# Patient Record
Sex: Female | Born: 1975
Health system: Southern US, Community
[De-identification: ages and names within clinical notes are randomized; demographics above are authoritative.]

## PROBLEM LIST (undated history)

## (undated) ENCOUNTER — Ambulatory Visit: Admission: EM | Payer: 59 | Source: Home / Self Care

## (undated) DIAGNOSIS — N979 Female infertility, unspecified: Secondary | ICD-10-CM

## (undated) DIAGNOSIS — N939 Abnormal uterine and vaginal bleeding, unspecified: Secondary | ICD-10-CM

## (undated) DIAGNOSIS — I10 Essential (primary) hypertension: Secondary | ICD-10-CM

## (undated) DIAGNOSIS — N200 Calculus of kidney: Secondary | ICD-10-CM

## (undated) DIAGNOSIS — T7840XA Allergy, unspecified, initial encounter: Secondary | ICD-10-CM

## (undated) HISTORY — DX: Allergy, unspecified, initial encounter: T78.40XA

## (undated) HISTORY — DX: Essential (primary) hypertension: I10

## (undated) HISTORY — PX: OTHER SURGICAL HISTORY: SHX169

## (undated) HISTORY — DX: Calculus of kidney: N20.0

## (undated) HISTORY — DX: Female infertility, unspecified: N97.9

## (undated) HISTORY — DX: Abnormal uterine and vaginal bleeding, unspecified: N93.9

---

## 2020-01-23 ENCOUNTER — Encounter: Payer: Self-pay | Admitting: Physician Assistant

## 2020-01-23 ENCOUNTER — Ambulatory Visit (INDEPENDENT_AMBULATORY_CARE_PROVIDER_SITE_OTHER): Payer: No Typology Code available for payment source | Admitting: Physician Assistant

## 2020-01-23 ENCOUNTER — Other Ambulatory Visit: Payer: Self-pay

## 2020-01-23 VITALS — BP 118/76 | HR 88 | Temp 98.3°F | Ht 63.25 in | Wt 182.6 lb

## 2020-01-23 DIAGNOSIS — J3089 Other allergic rhinitis: Secondary | ICD-10-CM

## 2020-01-23 DIAGNOSIS — Z7689 Persons encountering health services in other specified circumstances: Secondary | ICD-10-CM | POA: Diagnosis not present

## 2020-01-23 DIAGNOSIS — Z01419 Encounter for gynecological examination (general) (routine) without abnormal findings: Secondary | ICD-10-CM | POA: Diagnosis not present

## 2020-01-23 DIAGNOSIS — Z0000 Encounter for general adult medical examination without abnormal findings: Secondary | ICD-10-CM | POA: Diagnosis not present

## 2020-01-23 MED ORDER — FLUTICASONE PROPIONATE 50 MCG/ACT NA SUSP: 2.0000 | Freq: Every day | NASAL | 2 refills | Status: AC | PRN

## 2020-01-23 NOTE — Progress Notes (Signed)
New Patient Office Visit  Subjective:  Patient ID: Claire King, female    DOB: August 13, 1975  Age: 44 y.o. MRN: [id number]  CC:  Chief Complaint  Patient presents with  . Establish Care    HPI Claire King presents to establish care and with concerns of elevated BP.  Patient reports she checks her BP at home few times a week and has noticed her readings consistently in the 130s-140/90s.  Denies headache, vision changes, chest pain, dizziness, or palpitations. Denies prior history of hypertension. 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 Reports poor hydration, usually when working isn't at good but tries to stay as hydrated as possible.  She moved from [place] about a year and a half ago, and has been having trouble with allergies. She does take Zyrtec when needed.  States her husband does report she sometimes snores, which she states could be related to her postnasal drainage and allergy symptoms. Pt denies history of hyperlipidemia, diabetes mellitus, or thyroid disease.  No past medical history on file. The histories are not reviewed yet. Please review them in the "History" navigator section and refresh this SmartLink.  Family History  Problem Relation Age of Onset  . Lung cancer Maternal Grandmother     Social History   Socioeconomic History  . Marital status: Married    Spouse name: Not on file  . Number of children: Not on file  . Years of education: Not on file  . Highest education level: Not on file  Occupational History  . Not on file  Tobacco Use  . Smoking status: Current Every Day Smoker    Packs/day: 0.25    Years: 15.00    Pack years: 3.75    Types: Cigarettes  . Smokeless tobacco: Never Used  Vaping Use  . Vaping Use: Never used  Substance and Sexual Activity  . Alcohol use: Never  . Drug use: Never  . Sexual activity: Yes    Birth control/protection: None    Comment: infertility  Other Topics Concern  . Not on file  Social History Narrative  . Not on  file   Social Determinants of Health   Financial Resource Strain:   . Difficulty of Paying Living Expenses:   Food Insecurity:   . Worried About Programme researcher, broadcasting/film/video in the Last Year:   . Barista in the Last Year:   Transportation Needs:   . Freight forwarder (Medical):   Marland Kitchen Lack of Transportation (Non-Medical):   Physical Activity:   . Days of Exercise per Week:   . Minutes of Exercise per Session:   Stress:   . Feeling of Stress :   Social Connections:   . Frequency of Communication with Friends and Family:   . Frequency of Social Gatherings with Friends and Family:   . Attends Religious Services:   . Active Member of Clubs or Organizations:   . Attends Banker Meetings:   Marland Kitchen Marital Status:   Intimate Partner Violence:   . Fear of Current or Ex-Partner:   . Emotionally Abused:   Marland Kitchen Physically Abused:   . Sexually Abused:     ROS Review of Systems  A fourteen system review of systems was performed and found to be positive as per HPI.  Objective:   Today's Vitals: BP 118/76   Pulse 88   Temp 98.3 F (36.8 C) (Oral)   Ht 5' 3.25" (1.607 m)   Wt 182 lb 9.6 oz (82.8  kg)   LMP 01/11/2020 (Exact Date)   SpO2 97% Comment: on RA  BMI 32.09 kg/m   Physical Exam General:  Well Developed, well nourished, appropriate for stated age.  Neuro:  Alert and oriented,  extra-ocular muscles intact  HEENT:  Normocephalic, atraumatic, conjunctiva clear, normal TM of both ears, neck supple Skin:  no gross rash, warm, pink. Cardiac:  RRR, S1 S2 Respiratory:  ECTA B/L and A/P, Not using accessory muscles, speaking in full sentences- unlabored. Vascular:  Ext warm, no cyanosis apprec.; cap RF less 2 sec. No gross esema Psych:  No HI/SI, judgement and insight good, Euthymic mood. Full Affect.   Assessment & Plan:   Problem List Items Addressed This Visit    None    Visit Diagnoses    Encounter to establish care    -  Primary   Well woman exam with  routine gynecological exam       Relevant Orders   Ambulatory referral to Obstetrics / Gynecology   Healthcare maintenance       Relevant Orders   CBC with Differential/Platelet   Comprehensive metabolic panel   Hemoglobin A1c   Lipid panel   TSH   Environmental and seasonal allergies       Relevant Medications   fluticasone (FLONASE) 50 MCG/ACT nasal spray     Healthcare maintenance: -Blood pressure today is WNL, so recommend to continue with ambulatory BP and pulse monitoring. -Discussed with patient secondary causes of hypertension including OSA.  Will continue to monitor and if more symptoms develop besides snoring will consider sleep study. -Continue good hydration. -Placed referral to OB/GYN for female examination with gynecologic exam. -Follow-up for CPE and FBW in 2 to 3 months.  Environmental and seasonal allergies: -Continue Zyrtec on a daily basis and start Flonase. -Recommend nasal rinses, especially before using nasal spray.  Outpatient Encounter Medications as of 01/23/2020  Medication Sig  . fluticasone (FLONASE) 50 MCG/ACT nasal spray Place 2 sprays into both nostrils daily as needed for allergies or rhinitis.   No facility-administered encounter medications on file as of 01/23/2020.    Follow-up: Return for CPE and FBW in 2-3 months.   Note:  This note was prepared with assistance of Dragon voice recognition software. Occasional wrong-word or sound-a-like substitutions may have occurred due to the inherent limitations of voice recognition software.  Mayer Masker, PA-C

## 2020-02-09 ENCOUNTER — Encounter: Payer: Self-pay | Admitting: Physician Assistant

## 2020-03-20 ENCOUNTER — Other Ambulatory Visit: Payer: Self-pay | Admitting: Physician Assistant

## 2020-03-20 DIAGNOSIS — Z0000 Encounter for general adult medical examination without abnormal findings: Secondary | ICD-10-CM

## 2020-03-22 ENCOUNTER — Other Ambulatory Visit: Payer: Self-pay

## 2020-03-22 ENCOUNTER — Other Ambulatory Visit: Payer: No Typology Code available for payment source

## 2020-03-22 DIAGNOSIS — Z0000 Encounter for general adult medical examination without abnormal findings: Secondary | ICD-10-CM

## 2020-03-23 LAB — CBC
Hematocrit: 45.3 % (ref 34.0–46.6)
Hemoglobin: 15.2 g/dL (ref 11.1–15.9)
MCH: 30.3 pg (ref 26.6–33.0)
MCHC: 33.6 g/dL (ref 31.5–35.7)
MCV: 90 fL (ref 79–97)
Platelets: 84 10*3/uL — CL (ref 150–450)
RBC: 5.01 x10E6/uL (ref 3.77–5.28)
RDW: 12.4 % (ref 11.7–15.4)
WBC: 3.6 10*3/uL (ref 3.4–10.8)

## 2020-03-23 LAB — COMPREHENSIVE METABOLIC PANEL
ALT: 16 IU/L (ref 0–32)
AST: 14 IU/L (ref 0–40)
Albumin/Globulin Ratio: 1.7 (ref 1.2–2.2)
Albumin: 4.2 g/dL (ref 3.8–4.8)
Alkaline Phosphatase: 100 IU/L (ref 48–121)
BUN/Creatinine Ratio: 14 (ref 9–23)
BUN: 12 mg/dL (ref 6–24)
Bilirubin Total: 0.3 mg/dL (ref 0.0–1.2)
CO2: 24 mmol/L (ref 20–29)
Calcium: 8.9 mg/dL (ref 8.7–10.2)
Chloride: 104 mmol/L (ref 96–106)
Creatinine, Ser: 0.83 mg/dL (ref 0.57–1.00)
GFR calc Af Amer: 99 mL/min/{1.73_m2} (ref >59)
GFR calc non Af Amer: 86 mL/min/{1.73_m2} (ref >59)
Globulin, Total: 2.5 g/dL (ref 1.5–4.5)
Glucose: 82 mg/dL (ref 65–99)
Potassium: 4.4 mmol/L (ref 3.5–5.2)
Sodium: 141 mmol/L (ref 134–144)
Total Protein: 6.7 g/dL (ref 6.0–8.5)

## 2020-03-23 LAB — HEMOGLOBIN A1C
Est. average glucose Bld gHb Est-mCnc: 103 mg/dL
Hgb A1c MFr Bld: 5.2 % (ref 4.8–5.6)

## 2020-03-23 LAB — LIPID PANEL
Chol/HDL Ratio: 3 ratio (ref 0.0–4.4)
Cholesterol, Total: 133 mg/dL (ref 100–199)
HDL: 44 mg/dL (ref >39)
LDL Chol Calc (NIH): 74 mg/dL (ref 0–99)
Triglycerides: 72 mg/dL (ref 0–149)
VLDL Cholesterol Cal: 15 mg/dL (ref 5–40)

## 2020-03-23 LAB — TSH: TSH: 2.43 u[IU]/mL (ref 0.450–4.500)

## 2020-03-25 ENCOUNTER — Encounter: Payer: No Typology Code available for payment source | Admitting: Physician Assistant

## 2020-03-28 ENCOUNTER — Ambulatory Visit (INDEPENDENT_AMBULATORY_CARE_PROVIDER_SITE_OTHER): Payer: No Typology Code available for payment source | Admitting: Obstetrics and Gynecology

## 2020-03-28 ENCOUNTER — Other Ambulatory Visit: Payer: Self-pay

## 2020-03-28 ENCOUNTER — Other Ambulatory Visit (HOSPITAL_COMMUNITY)
Admission: RE | Admit: 2020-03-28 | Discharge: 2020-03-28 | Disposition: A | Discharge status: Home / Self Care | Payer: No Typology Code available for payment source | Source: Ambulatory Visit | Attending: Obstetrics and Gynecology | Admitting: Obstetrics and Gynecology

## 2020-03-28 ENCOUNTER — Encounter: Payer: Self-pay | Admitting: Obstetrics and Gynecology

## 2020-03-28 VITALS — BP 132/66 | HR 99 | Ht 63.0 in | Wt 185.2 lb

## 2020-03-28 DIAGNOSIS — N921 Excessive and frequent menstruation with irregular cycle: Secondary | ICD-10-CM

## 2020-03-28 DIAGNOSIS — Z01419 Encounter for gynecological examination (general) (routine) without abnormal findings: Secondary | ICD-10-CM | POA: Diagnosis not present

## 2020-03-28 DIAGNOSIS — R6882 Decreased libido: Secondary | ICD-10-CM | POA: Diagnosis not present

## 2020-03-28 DIAGNOSIS — Z124 Encounter for screening for malignant neoplasm of cervix: Secondary | ICD-10-CM

## 2020-03-28 DIAGNOSIS — F17200 Nicotine dependence, unspecified, uncomplicated: Secondary | ICD-10-CM

## 2020-03-28 DIAGNOSIS — Z8742 Personal history of other diseases of the female genital tract: Secondary | ICD-10-CM | POA: Diagnosis not present

## 2020-03-28 DIAGNOSIS — N6452 Nipple discharge: Secondary | ICD-10-CM | POA: Diagnosis not present

## 2020-03-28 MED ORDER — MEDROXYPROGESTERONE ACETATE 10 MG PO TABS: ORAL_TABLET | ORAL | 0 refills | Status: DC

## 2020-03-28 NOTE — Patient Instructions (Signed)
EXERCISE AND DIET:  We recommended that you start or continue a regular exercise program for good health. Regular exercise means any activity that makes your heart beat faster and makes you sweat.  We recommend exercising at least 30 minutes per day at least 3 days a week, preferably 4 or 5.  We also recommend a diet low in fat and sugar.  Inactivity, poor dietary choices and obesity can cause diabetes, heart attack, stroke, and kidney damage, among others.    ALCOHOL AND SMOKING:  Women should limit their alcohol intake to no more than 7 drinks/beers/glasses of wine (combined, not each!) per week. Moderation of alcohol intake to this level decreases your risk of breast cancer and liver damage. And of course, no recreational drugs are part of a healthy lifestyle.  And absolutely no smoking or even second hand smoke. Most people know smoking can cause heart and lung diseases, but did you know it also contributes to weakening of your bones? Aging of your skin?  Yellowing of your teeth and nails?  CALCIUM AND VITAMIN D:  Adequate intake of calcium and Vitamin D are recommended.  The recommendations for exact amounts of these supplements seem to change often, but generally speaking 1,000 mg of calcium (between diet and supplement) and 800 units of Vitamin D per day seems prudent. Certain women may benefit from higher intake of Vitamin D.  If you are among these women, your doctor will have told you during your visit.    PAP SMEARS:  Pap smears, to check for cervical cancer or precancers,  have traditionally been done yearly, although recent scientific advances have shown that most women can have pap smears less often.  However, every woman still should have a physical exam from her gynecologist every year. It will include a breast check, inspection of the vulva and vagina to check for abnormal growths or skin changes, a visual exam of the cervix, and then an exam to evaluate the size and shape of the uterus and  ovaries.  And after 44 years of age, a rectal exam is indicated to check for rectal cancers. We will also provide age appropriate advice regarding health maintenance, like when you should have certain vaccines, screening for sexually transmitted diseases, bone density testing, colonoscopy, mammograms, etc.   MAMMOGRAMS:  All women over 40 years old should have a yearly mammogram. Many facilities now offer a "3D" mammogram, which may cost around $50 extra out of pocket. If possible,  we recommend you accept the option to have the 3D mammogram performed.  It both reduces the number of women who will be called back for extra views which then turn out to be normal, and it is better than the routine mammogram at detecting truly abnormal areas.    COLON CANCER SCREENING: Now recommend starting at age 45. At this time colonoscopy is not covered for routine screening until 50. There are take home tests that can be done between 45-49.   COLONOSCOPY:  Colonoscopy to screen for colon cancer is recommended for all women at age 50.  We know, you hate the idea of the prep.  We agree, BUT, having colon cancer and not knowing it is worse!!  Colon cancer so often starts as a polyp that can be seen and removed at colonscopy, which can quite literally save your life!  And if your first colonoscopy is normal and you have no family history of colon cancer, most women don't have to have it again for   10 years.  Once every ten years, you can do something that may end up saving your life, right?  We will be happy to help you get it scheduled when you are ready.  Be sure to check your insurance coverage so you understand how much it will cost.  It may be covered as a preventative service at no cost, but you should check your particular policy.      Breast Self-Awareness Breast self-awareness means being familiar with how your breasts look and feel. It involves checking your breasts regularly and reporting any changes to your  health care provider. Practicing breast self-awareness is important. A change in your breasts can be a sign of a serious medical problem. Being familiar with how your breasts look and feel allows you to find any problems early, when treatment is more likely to be successful. All women should practice breast self-awareness, including women who have had breast implants. How to do a breast self-exam One way to learn what is normal for your breasts and whether your breasts are changing is to do a breast self-exam. To do a breast self-exam: Look for Changes  1. Remove all the clothing above your waist. 2. Stand in front of a mirror in a room with good lighting. 3. Put your hands on your hips. 4. Push your hands firmly downward. 5. Compare your breasts in the mirror. Look for differences between them (asymmetry), such as: ? Differences in shape. ? Differences in size. ? Puckers, dips, and bumps in one breast and not the other. 6. Look at each breast for changes in your skin, such as: ? Redness. ? Scaly areas. 7. Look for changes in your nipples, such as: ? Discharge. ? Bleeding. ? Dimpling. ? Redness. ? A change in position. Feel for Changes Carefully feel your breasts for lumps and changes. It is best to do this while lying on your back on the floor and again while sitting or standing in the shower or tub with soapy water on your skin. Feel each breast in the following way:  Place the arm on the side of the breast you are examining above your head.  Feel your breast with the other hand.  Start in the nipple area and make  inch (2 cm) overlapping circles to feel your breast. Use the pads of your three middle fingers to do this. Apply light pressure, then medium pressure, then firm pressure. The light pressure will allow you to feel the tissue closest to the skin. The medium pressure will allow you to feel the tissue that is a little deeper. The firm pressure will allow you to feel the tissue  close to the ribs.  Continue the overlapping circles, moving downward over the breast until you feel your ribs below your breast.  Move one finger-width toward the center of the body. Continue to use the  inch (2 cm) overlapping circles to feel your breast as you move slowly up toward your collarbone.  Continue the up and down exam using all three pressures until you reach your armpit.  Write Down What You Find  Write down what is normal for each breast and any changes that you find. Keep a written record with breast changes or normal findings for each breast. By writing this information down, you do not need to depend only on memory for size, tenderness, or location. Write down where you are in your menstrual cycle, if you are still menstruating. If you are having trouble noticing differences   in your breasts, do not get discouraged. With time you will become more familiar with the variations in your breasts and more comfortable with the exam. How often should I examine my breasts? Examine your breasts every month. If you are breastfeeding, the best time to examine your breasts is after a feeding or after using a breast pump. If you menstruate, the best time to examine your breasts is 5-7 days after your period is over. During your period, your breasts are lumpier, and it may be more difficult to notice changes. When should I see my health care provider? See your health care provider if you notice:  A change in shape or size of your breasts or nipples.  A change in the skin of your breast or nipples, such as a reddened or scaly area.  Unusual discharge from your nipples.  A lump or thick area that was not there before.  Pain in your breasts.  Anything that concerns you.  

## 2020-03-28 NOTE — Progress Notes (Signed)
44 y.o. [id number] Married White or Caucasian Not Hispanic or Latino female here for annual exam.  She has been spotting since Aug 12.  Period Pattern: Regular Menstrual Flow: Heavy, Light Menstrual Control: Panty liner, Thin pad Menstrual Control Change Freq (Hours): 8 Dysmenorrhea: None   Prior to August 12 cycles were monthly, they started getting heavy in May. Cycles last x 4 days. Prior to May she was saturating a tampon in up to 6-7 hours, since May she was saturating a super tampon in up to 2 hours. She started having mid cycle spotting in May. She started spotting on August 12 and has continued spotting.  Not anemic, TSH normal last week.  No vasomotor symptoms.   She has a 15 year h/o occasional leakage of yellow green d/c from her left breast.   Not sexually active since January, no libido. Marriage is great. Under stress.   No LMP recorded.          Sexually active: Yes.    The current method of family planning is  infertility .    Exercising: No.  The patient does not participate in regular exercise at present. Smoker:  Yes, 1/2 PPD, no plans to quit.   Health Maintenance: Pap:  10 years ago  History of abnormal Pap:  no MMG:  none BMD:   none Colonoscopy: none  TDaP:  Up to date  Gardasil: none    reports that she has been smoking cigarettes. She has a 7.50 pack-year smoking history. She has never used smokeless tobacco. She reports that she does not drink alcohol and does not use drugs. She is a Charity fundraiser on a covid floor. She moved here from [place] 2 years ago. Son is 20, living with them, working, considering school.  Past Medical History:  Diagnosis Date  . Abnormal uterine bleeding   . Infertility, female     Past Surgical History:  Procedure Laterality Date  . kidney stone with stent      Current Outpatient Medications  Medication Sig Dispense Refill  . fluticasone (FLONASE) 50 MCG/ACT nasal spray Place 2 sprays into both nostrils daily as needed for  allergies or rhinitis. 15.8 mL 2   No current facility-administered medications for this visit.    Family History  Problem Relation Age of Onset  . Lung cancer Maternal Grandmother     Review of Systems  Genitourinary: Positive for menstrual problem and vaginal bleeding.  All other systems reviewed and are negative.   Exam:   BP 132/66   Pulse 99   Ht 5\' 3"  (1.6 m)   Wt 185 lb 3.2 oz (84 kg)   SpO2 100%   BMI 32.81 kg/m   Weight change: @WEIGHTCHANGE @ Height:   Height: 5\' 3"  (160 cm)  Ht Readings from Last 3 Encounters:  03/28/20 5\' 3"  (1.6 m)  01/23/20 5' 3.25" (1.607 m)    General appearance: alert, cooperative and appears stated age Head: Normocephalic, without obvious abnormality, atraumatic Neck: no adenopathy, supple, symmetrical, trachea midline and thyroid normal to inspection and palpation Lungs: clear to auscultation bilaterally Cardiovascular: regular rate and rhythm Breasts: normal appearance, no masses or tenderness Abdomen: soft, non-tender; non distended,  no masses,  no organomegaly Extremities: extremities normal, atraumatic, no cyanosis or edema Skin: Skin color, texture, turgor normal. No rashes or lesions Lymph nodes: Cervical, supraclavicular, and axillary nodes normal. No abnormal inguinal nodes palpated Neurologic: Grossly normal   Pelvic: External genitalia:  no lesions  Urethra:  normal appearing urethra with no masses, tenderness or lesions              Bartholins and Skenes: normal                 Vagina: normal appearing vagina with normal color and discharge, no lesions. Small amount of blood vagina.               Cervix: no lesions               Bimanual Exam:  Uterus:  normal size, contour, position, consistency, mobility, non-tender and retroverted              Adnexa: no mass, fullness, tenderness               Rectovaginal: Confirms               Anus:  normal sphincter tone, no lesions  The risks of endometrial biopsy  were reviewed and a consent was obtained.  A speculum was placed in the vagina and the cervix was cleansed with betadine. A tenaculum was placed on the cervix and the pipelle was placed into the endometrial cavity. The uterus sounded to 8 cm. The endometrial biopsy was performed, taking care to get a representative sample, sampling 360 degrees of the uterine cavity. moderate tissue was obtained. The tenaculum and speculum were removed. There were no complications.    Carolynn Serve chaperoned for the exam.  A:  Well Woman with normal exam  AUB, menometrorrhagia suspect anovulatory bleed currently.  Not sexually in 9 months.  No libido  Left nipple discharge   P:   Pap with hpv  Endometrial biopsy done  Return for ultrasound  Discussed breast self exam  Discussed calcium and vit D intake  Provera x 10 days  Prolactin level  Diagnostic breast imaging, attention left breast  Screening labs UTD

## 2020-03-29 ENCOUNTER — Telehealth: Payer: Self-pay | Admitting: *Deleted

## 2020-03-29 ENCOUNTER — Other Ambulatory Visit: Payer: Self-pay | Admitting: *Deleted

## 2020-03-29 DIAGNOSIS — N6452 Nipple discharge: Secondary | ICD-10-CM

## 2020-03-29 LAB — PROLACTIN: Prolactin: 36.8 ng/mL — ABNORMAL HIGH (ref 4.8–23.3)

## 2020-03-29 NOTE — Telephone Encounter (Signed)
-----   Message from Romualdo Bolk, MD sent at 03/29/2020 11:15 AM EDT ----- Please inform the patient that the prolactin level is elevated and have her return for a fasting level.  Her pap and endometrial biopsy

## 2020-03-29 NOTE — Telephone Encounter (Signed)
Spoke with patient. Confirmed no previous imaging. Patient request to schedule at [facility].  Orders placed for bilateral Dx MMG and left breast US, if needed. Patient states her schedule varies, she would prefer to call directly to schedule. Patient is aware to contact the office if any additional questions or assistance with scheduling.   Patient is in MMG hold.   Encounter closed.

## 2020-03-29 NOTE — Telephone Encounter (Signed)
Patient returned call. Results reviewed with patient as seen below and she verbalized understanding. Patient states she is having blood work done on 04-02-20 at PCP office, asking if she can have level drawn with that blood work.   RN attempted to reach Primary Care at [facility], but office closed at 12pm today. RN advised patient that order for lab work had been placed, but would need to confirm with PCP on Monday that they would be able to draw lab there. Advised patient would return call on Monday after speaking with PCFO. Patient verbalized understanding and agreeable.

## 2020-03-29 NOTE — Telephone Encounter (Signed)
Patient returned a call to Jill.   

## 2020-03-29 NOTE — Telephone Encounter (Signed)
Left message to call Noreene Larsson, RN at [facility] [phone].   No previous MMG hx on file.  Need to confirm where patient wants to go for breast  imaging.   Placed in MMG hold.

## 2020-03-29 NOTE — Telephone Encounter (Signed)
Message left to return call to Triage Nurse at [phone].    

## 2020-03-29 NOTE — Telephone Encounter (Signed)
-----   Message from Romualdo Bolk, MD sent at 03/28/2020  6:33 PM EDT ----- Please set her up for diagnostic breast imaging. H/o intermittent yellow/green d/c from left nipple for 15 years.  Thanks, Noreene Larsson

## 2020-04-01 ENCOUNTER — Other Ambulatory Visit: Payer: Self-pay | Admitting: Physician Assistant

## 2020-04-01 ENCOUNTER — Telehealth: Payer: Self-pay | Admitting: *Deleted

## 2020-04-01 DIAGNOSIS — Z0000 Encounter for general adult medical examination without abnormal findings: Secondary | ICD-10-CM

## 2020-04-01 DIAGNOSIS — N939 Abnormal uterine and vaginal bleeding, unspecified: Secondary | ICD-10-CM

## 2020-04-01 LAB — CYTOLOGY - PAP
Comment: NEGATIVE
Diagnosis: NEGATIVE
High risk HPV: NEGATIVE

## 2020-04-01 LAB — SURGICAL PATHOLOGY

## 2020-04-01 NOTE — Telephone Encounter (Signed)
Call to [facility], spoke with Joselyn Glassman, who states okay for patient to have labs drawn tomorrow. States their office uses [facility] services and phlebotomist will release order. Future order present for prolactin order.

## 2020-04-01 NOTE — Telephone Encounter (Signed)
Leda Min, RN  04/01/2020 3:30 PM EDT Back to Top    Left message to call Noreene Larsson, RN at [facility] [phone].

## 2020-04-01 NOTE — Telephone Encounter (Signed)
-----   Message from Romualdo Bolk, MD sent at 04/01/2020  1:08 PM EDT ----- Please let the patient know that her endometrial biopsy is benign and set her up for a pelvic ultrasound to further evaluate her AUB

## 2020-04-01 NOTE — Telephone Encounter (Signed)
Call to patient. Patient advised could have prolactin level drawn tomorrow at [facility]. Patient advised would need to fast prior to appointment. Patient verbalized understanding and appreciative.   Routing to provider and will close encounter.

## 2020-04-02 ENCOUNTER — Other Ambulatory Visit: Payer: Self-pay

## 2020-04-02 ENCOUNTER — Other Ambulatory Visit: Payer: No Typology Code available for payment source

## 2020-04-02 DIAGNOSIS — N6452 Nipple discharge: Secondary | ICD-10-CM

## 2020-04-02 DIAGNOSIS — Z0000 Encounter for general adult medical examination without abnormal findings: Secondary | ICD-10-CM

## 2020-04-03 ENCOUNTER — Telehealth: Payer: Self-pay | Admitting: Physician Assistant

## 2020-04-03 DIAGNOSIS — D696 Thrombocytopenia, unspecified: Secondary | ICD-10-CM

## 2020-04-03 LAB — CBC
Hematocrit: 44.4 % (ref 34.0–46.6)
Hemoglobin: 14.4 g/dL (ref 11.1–15.9)
MCH: 29 pg (ref 26.6–33.0)
MCHC: 32.4 g/dL (ref 31.5–35.7)
MCV: 90 fL (ref 79–97)
Platelets: 78 10*3/uL — CL (ref 150–450)
RBC: 4.96 x10E6/uL (ref 3.77–5.28)
RDW: 12.7 % (ref 11.7–15.4)
WBC: 4 10*3/uL (ref 3.4–10.8)

## 2020-04-03 LAB — PROLACTIN: Prolactin: 27.1 ng/mL — ABNORMAL HIGH (ref 4.8–23.3)

## 2020-04-03 NOTE — Telephone Encounter (Signed)
Spoke with patient, advised of results and recommendations as seen below per Dr. Oscar La.  Patient agreeable to proceed with PUS. PUS scheduled for 04/16/20 at 2:30pm, consult to follow with Dr. Oscar La. Patient declines earlier appt. Order placed for precert.   Patient reports she started Provera 10 mg bid on 9/16, bleeding stopped on 9/19. She switched to 10 mg daily on 9/20. Patient reports spotting starting this morning, asking of she should continue daily?   Advised to continue Provera 10 mg daily to complete 10 days as prescribed, contact office if bleeding increases or any new symptoms develop. Advised I will update Dr. Oscar La and return call if any additional recommendations. Patient will need new Provera Rx if she needs to increase dosage.  Dr. Oscar La -please review.    Cc: Hayley Carder

## 2020-04-03 NOTE — Telephone Encounter (Signed)
-----   Message from Mayer Masker, [place] sent at 04/03/2020  8:17 AM EDT ----- Please call Ms. Landress and notify CBC shows platelets have deceased from prior and recommend referral to hematology for further evaluation/work up.   Thank you, Kandis Cocking

## 2020-04-03 NOTE — Telephone Encounter (Signed)
Patient is aware of lab results and is agreeable to referral to Hematology.   Referral has been placed as urgent per [facility]. AS, CMA

## 2020-04-04 ENCOUNTER — Other Ambulatory Visit: Payer: Self-pay | Admitting: *Deleted

## 2020-04-04 DIAGNOSIS — N6452 Nipple discharge: Secondary | ICD-10-CM

## 2020-04-04 DIAGNOSIS — R7989 Other specified abnormal findings of blood chemistry: Secondary | ICD-10-CM

## 2020-04-04 NOTE — Telephone Encounter (Signed)
Reviewed with Dr. Jertson, no additional recommendations.   Encounter closed.  

## 2020-04-05 ENCOUNTER — Telehealth: Payer: Self-pay

## 2020-04-05 ENCOUNTER — Telehealth: Payer: Self-pay | Admitting: Hematology and Oncology

## 2020-04-05 NOTE — Telephone Encounter (Signed)
Received a new hem referral from Jordan Hawks, PA for the thrombocytopenia. Claire King has been cld and scheduled to see Dr. Pamelia Hoit on 10/5 at 830am. Pt aware to arrive 30 minutes early.

## 2020-04-05 NOTE — Telephone Encounter (Signed)
Spoke with patient regarding benefits for scheduled Pelvic ultrasound. Patient acknowledges understanding of information presented. Encounter closed. 

## 2020-04-11 ENCOUNTER — Other Ambulatory Visit: Payer: Self-pay

## 2020-04-11 ENCOUNTER — Ambulatory Visit
Admission: RE | Admit: 2020-04-11 | Discharge: 2020-04-11 | Disposition: A | Discharge status: Home / Self Care | Payer: No Typology Code available for payment source | Source: Ambulatory Visit | Attending: Obstetrics and Gynecology | Admitting: Obstetrics and Gynecology

## 2020-04-11 DIAGNOSIS — N6452 Nipple discharge: Secondary | ICD-10-CM

## 2020-04-11 DIAGNOSIS — R7989 Other specified abnormal findings of blood chemistry: Secondary | ICD-10-CM

## 2020-04-11 MED ORDER — GADOBENATE DIMEGLUMINE 529 MG/ML IV SOLN
10.0000 mL | Freq: Once | INTRAVENOUS | Status: AC | PRN
  Administered 2020-04-11: 10 mL via INTRAVENOUS

## 2020-04-12 ENCOUNTER — Telehealth: Payer: Self-pay

## 2020-04-12 ENCOUNTER — Encounter: Payer: Self-pay | Admitting: Obstetrics and Gynecology

## 2020-04-12 NOTE — Telephone Encounter (Signed)
Pt sent following mychart message:   Kenlyn, Lose, MD 3 hours ago (11:45 AM)  SS I completed the provera yesterday and have started bleeding again heavier than spotting.  It is a little early for when my normal period should start but I wanted to make you aware.  Noted small clots too.

## 2020-04-12 NOTE — Telephone Encounter (Signed)
Spoke with pt. Pt sent mychart message to give update to Dr Oscar La.  Pt states started having vaginal bleeding on 9/30 PM after finishing Provera Rx the same day. Pt states changing a tampon/pad once a day for now, but seemed "heavier" to her than spotting with tiny clots.  Pt denies heavy, soaking pad/tampon, large clots, feeling lightheaded or dizzy at this time.  Pt advised to continue to monitor and calendar bleeding. Pt agreeable.  ER precautions given over weekend.  Pt has OV planned on 04/16/20. Pt advised to keep appt with Dr Oscar La to discuss further. Pt agreeable and verbalized understanding to date and time of appt.   Routing [facility] Encounter closed.

## 2020-04-15 NOTE — Progress Notes (Signed)
[facility] CONSULT NOTE  Patient Care Team: Mayer Masker, PA-C as PCP - General (Physician Assistant)  CHIEF COMPLAINTS/PURPOSE OF CONSULTATION:  Newly diagnosed thrombocytopenia   HISTORY OF PRESENTING ILLNESS:  Claire King 44 y.o. female is here because of recent diagnosis of thrombocytopenia. She is referred by Jordan Hawks, PA. Labs on 04/02/20 showed platelets 78, and on 03/22/20 platelets 84. She presents to the clinic today for initial evaluation.  She has not had any problems with bruising or bleeding.  There has not been any new medications have been started.  She has not been ill or sick.  Her only complaint is intermittent headaches in the mornings.  She is a cardiac nurse.  She has been experiencing in irregular cycles and is seeing a gynecologist and she is going to be on Depo-Provera.  I reviewed her records extensively and collaborated the history with the patient.  MEDICAL HISTORY:  Past Medical History:  Diagnosis Date  . Abnormal uterine bleeding   . Infertility, female     SURGICAL HISTORY: Past Surgical History:  Procedure Laterality Date  . kidney stone with stent      SOCIAL HISTORY: Social History   Socioeconomic History  . Marital status: Married    Spouse name: Not on file  . Number of children: Not on file  . Years of education: Not on file  . Highest education level: Not on file  Occupational History  . Not on file  Tobacco Use  . Smoking status: Current Every Day Smoker    Packs/day: 0.50    Years: 15.00    Pack years: 7.50    Types: Cigarettes  . Smokeless tobacco: Never Used  Vaping Use  . Vaping Use: Never used  Substance and Sexual Activity  . Alcohol use: Never  . Drug use: Never  . Sexual activity: Yes    Birth control/protection: None    Comment: infertility  Other Topics Concern  . Not on file  Social History Narrative  . Not on file   Social Determinants of Health   Financial Resource Strain:     . Difficulty of Paying Living Expenses: Not on file  Food Insecurity:   . Worried About Programme researcher, broadcasting/film/video in the Last Year: Not on file  . Ran Out of Food in the Last Year: Not on file  Transportation Needs:   . Lack of Transportation (Medical): Not on file  . Lack of Transportation (Non-Medical): Not on file  Physical Activity:   . Days of Exercise per Week: Not on file  . Minutes of Exercise per Session: Not on file  Stress:   . Feeling of Stress : Not on file  Social Connections:   . Frequency of Communication with Friends and Family: Not on file  . Frequency of Social Gatherings with Friends and Family: Not on file  . Attends Religious Services: Not on file  . Active Member of Clubs or Organizations: Not on file  . Attends Banker Meetings: Not on file  . Marital Status: Not on file  Intimate Partner Violence:   . Fear of Current or Ex-Partner: Not on file  . Emotionally Abused: Not on file  . Physically Abused: Not on file  . Sexually Abused: Not on file    FAMILY HISTORY: Family History  Problem Relation Age of Onset  . Lung cancer Maternal Grandmother     ALLERGIES:  has No Known Allergies.  MEDICATIONS:  Current Outpatient Medications  Medication  Sig Dispense Refill  . fluticasone (FLONASE) 50 MCG/ACT nasal spray Place 2 sprays into both nostrils daily as needed for allergies or rhinitis. 15.8 mL 2  . medroxyPROGESTERone (PROVERA) 10 MG tablet 1 tablet po BID until bleeding stops, then take 1 tablet a day for 10 days. 20 tablet 0   No current facility-administered medications for this visit.    REVIEW OF SYSTEMS:     All other systems were reviewed with the patient and are negative.  PHYSICAL EXAMINATION: ECOG PERFORMANCE STATUS: 1 - Symptomatic but completely ambulatory  Vitals:   04/16/20 0826  BP: (!) 140/98  Pulse: 97  Resp: 18  Temp: 98 F (36.7 C)   Filed Weights   04/16/20 0826  Weight: 182 lb 3.2 oz (82.6 kg)        LABORATORY DATA:  I have reviewed the data as listed Lab Results  Component Value Date   WBC 3.6 (L) 04/16/2020   HGB 13.9 04/16/2020   HCT 41.5 04/16/2020   MCV 89.1 04/16/2020   PLT 247 04/16/2020   Lab Results  Component Value Date   NA 141 03/22/2020   K 4.4 03/22/2020   CL 104 03/22/2020   CO2 24 03/22/2020    RADIOGRAPHIC STUDIES: I have personally reviewed the radiological reports and agreed with the findings in the report.  ASSESSMENT AND PLAN:  Thrombocytopenia (HCC) Lab review: 03/22/2020: Platelets 84 04/03/2020: Platelets 78, rest of the CBC normal Additional comments were made that there may be platelet aggregation of the sample.  Differential diagnosis: 1. Low-grade ITP 2. medication induced: On further review patient is not taking any medications that are associated with thrombocytopenia 3. Bone marrow factors 4. Hepatitis B/C 5. Splenomegaly: No enlarged spleen was palpable to physical exam. 6.  Spurious thrombocytopenia due to platelet clumping  We performed the blood work in citrate tube and found that her platelet count is normal at 247. Therefore no additional work-up is necessary.  Patient is very relieved to see this result. I would recommend her future CBCs to be drawn in a citrate tube (blue top tube) to avoid platelet clumping.  Please do not hesitate to call us if there are any further questions or concerns.   All questions were answered. The patient knows to call the clinic with any problems, questions or concerns.   Sabas Sous, MD, MPH 04/16/2020    I, Molly Dorshimer, am acting as scribe for Serena Croissant, MD.  I have reviewed the above documentation for accuracy and completeness, and I agree with the above.

## 2020-04-16 ENCOUNTER — Encounter: Payer: Self-pay | Admitting: Obstetrics and Gynecology

## 2020-04-16 ENCOUNTER — Ambulatory Visit: Payer: No Typology Code available for payment source | Admitting: Obstetrics and Gynecology

## 2020-04-16 ENCOUNTER — Inpatient Hospital Stay (HOSPITAL_BASED_OUTPATIENT_CLINIC_OR_DEPARTMENT_OTHER): Payer: No Typology Code available for payment source | Admitting: Hematology and Oncology

## 2020-04-16 ENCOUNTER — Ambulatory Visit (INDEPENDENT_AMBULATORY_CARE_PROVIDER_SITE_OTHER): Payer: No Typology Code available for payment source

## 2020-04-16 ENCOUNTER — Inpatient Hospital Stay: Payer: No Typology Code available for payment source | Attending: Hematology and Oncology

## 2020-04-16 ENCOUNTER — Other Ambulatory Visit: Payer: Self-pay

## 2020-04-16 VITALS — BP 122/64 | HR 87 | Ht 63.0 in | Wt 182.0 lb

## 2020-04-16 DIAGNOSIS — F17210 Nicotine dependence, cigarettes, uncomplicated: Secondary | ICD-10-CM | POA: Diagnosis not present

## 2020-04-16 DIAGNOSIS — N939 Abnormal uterine and vaginal bleeding, unspecified: Secondary | ICD-10-CM

## 2020-04-16 DIAGNOSIS — D696 Thrombocytopenia, unspecified: Secondary | ICD-10-CM | POA: Diagnosis present

## 2020-04-16 DIAGNOSIS — Z801 Family history of malignant neoplasm of trachea, bronchus and lung: Secondary | ICD-10-CM | POA: Diagnosis not present

## 2020-04-16 LAB — CBC WITH DIFFERENTIAL (CANCER CENTER ONLY)
Abs Immature Granulocytes: 0 10*3/uL (ref 0.00–0.07)
Basophils Absolute: 0 10*3/uL (ref 0.0–0.1)
Basophils Relative: 1 %
Eosinophils Absolute: 0.2 10*3/uL (ref 0.0–0.5)
Eosinophils Relative: 7 %
HCT: 41.5 % (ref 36.0–46.0)
Hemoglobin: 13.9 g/dL (ref 12.0–15.0)
Immature Granulocytes: 0 %
Lymphocytes Relative: 30 %
Lymphs Abs: 1.1 10*3/uL (ref 0.7–4.0)
MCH: 29.8 pg (ref 26.0–34.0)
MCHC: 33.5 g/dL (ref 30.0–36.0)
MCV: 89.1 fL (ref 80.0–100.0)
Monocytes Absolute: 0.4 10*3/uL (ref 0.1–1.0)
Monocytes Relative: 12 %
Neutro Abs: 1.8 10*3/uL (ref 1.7–7.7)
Neutrophils Relative %: 50 %
Platelet Count: 247 10*3/uL (ref 150–400)
RBC: 4.66 MIL/uL (ref 3.87–5.11)
RDW: 13.5 % (ref 11.5–15.5)
WBC Count: 3.6 10*3/uL — ABNORMAL LOW (ref 4.0–10.5)
nRBC: 0 % (ref 0.0–0.2)

## 2020-04-16 LAB — PLATELET BY CITRATE

## 2020-04-16 NOTE — Progress Notes (Signed)
GYNECOLOGY  VISIT   HPI: 44 y.o.   Married White or Caucasian Not Hispanic or Latino  female   [phone] with No LMP recorded.   here for evaluation of AUB. The patient was seen last month c/o a marked increase in monthly menstrual flow starting in 5/21 and then a likely anovulatory cycle. Endometrial biopsy returned with inactive endometrium with focal breakdown.   Not anemic, normal TSH, normal pap. Prolactin was elevated at 36.8, repeat prolactin still elevated at 27.1. Brain MRI without pituitary tumor, there were non specific changes (will f/u with primary). She has daily headaches, but denies migraines.   At the time of her visit last month the patient also reported left nipple discharge. She is scheduled for breast imaging in 2 days.   GYNECOLOGIC HISTORY: No LMP recorded. Contraception: abstaining, infertility Menopausal hormone therapy: none        OB History    Gravida  4   Para  2   Term  1   Preterm  1   AB  2   Living  1     SAB  1   TAB      Ectopic      Multiple      Live Births  1              Patient Active Problem List   Diagnosis Date Noted  . Thrombocytopenia (HCC) 04/16/2020    Past Medical History:  Diagnosis Date  . Abnormal uterine bleeding   . Infertility, female     Past Surgical History:  Procedure Laterality Date  . kidney stone with stent      Current Outpatient Medications  Medication Sig Dispense Refill  . fluticasone (FLONASE) 50 MCG/ACT nasal spray Place 2 sprays into both nostrils daily as needed for allergies or rhinitis. 15.8 mL 2  . medroxyPROGESTERone (PROVERA) 10 MG tablet 1 tablet po BID until bleeding stops, then take 1 tablet a day for 10 days. 20 tablet 0   No current facility-administered medications for this visit.     ALLERGIES: Patient has no known allergies.  Family History  Problem Relation Age of Onset  . Lung cancer Maternal Grandmother     Social History   Socioeconomic History  . Marital  status: Married    Spouse name: Not on file  . Number of children: Not on file  . Years of education: Not on file  . Highest education level: Not on file  Occupational History  . Not on file  Tobacco Use  . Smoking status: Current Every Day Smoker    Packs/day: 0.50    Years: 15.00    Pack years: 7.50    Types: Cigarettes  . Smokeless tobacco: Never Used  Vaping Use  . Vaping Use: Never used  Substance and Sexual Activity  . Alcohol use: Never  . Drug use: Never  . Sexual activity: Yes    Birth control/protection: None    Comment: infertility  Other Topics Concern  . Not on file  Social History Narrative  . Not on file   Social Determinants of Health   Financial Resource Strain:   . Difficulty of Paying Living Expenses: Not on file  Food Insecurity:   . Worried About Programme researcher, broadcasting/film/video in the Last Year: Not on file  . Ran Out of Food in the Last Year: Not on file  Transportation Needs:   . Lack of Transportation (Medical): Not on file  . Lack of  Transportation (Non-Medical): Not on file  Physical Activity:   . Days of Exercise per Week: Not on file  . Minutes of Exercise per Session: Not on file  Stress:   . Feeling of Stress : Not on file  Social Connections:   . Frequency of Communication with Friends and Family: Not on file  . Frequency of Social Gatherings with Friends and Family: Not on file  . Attends Religious Services: Not on file  . Active Member of Clubs or Organizations: Not on file  . Attends Banker Meetings: Not on file  . Marital Status: Not on file  Intimate Partner Violence:   . Fear of Current or Ex-Partner: Not on file  . Emotionally Abused: Not on file  . Physically Abused: Not on file  . Sexually Abused: Not on file    Review of Systems  All other systems reviewed and are negative.   PHYSICAL EXAMINATION:    There were no vitals taken for this visit.    General appearance: alert, cooperative and appears stated  age  Ultrasound images reviewed with the patient   ASSESSMENT Increase in menstrual flow since May, 2021. She went from saturating a tampon in 6 hours to saturating a tampon in 2 hours. She then had prolonged bleeding for over a month starting in 8/21. Endometrial biopsy with inactive endometrium. Mildly elevated prolactin with normal MRI of her pituitary. Normal ultrasound    PLAN Not a candidate for OCP's (smoker), discussed the mini-pill, mirena IUD, endometrial ablation. Discussed the risks and benefits of each She would like the mirena IUD, will order Information on the IUD given.   Over 10 minutes spent in counseling on options for treatment for abnormal uterine bleeding.

## 2020-04-16 NOTE — Assessment & Plan Note (Signed)
Lab review: 03/22/2020: Platelets 84 04/03/2020: Platelets 78, rest of the CBC normal Additional comments were made that there may be platelet aggregation of the sample.  Differential diagnosis: 1. Low-grade ITP 2. medication induced: On further review patient is not taking any medications that are associated with thrombocytopenia 3. Bone marrow factors 4. Hepatitis B/C 5. Splenomegaly: No enlarged spleen was palpable to physical exam. 6.  Spurious thrombocytopenia due to platelet clumping  Before we do extensive blood work I would like to repeat a CBC with citrate tube to confirm the platelet counts.  Patient is agreeable.  We will send the patient to the lab and have her come back and we will discuss the results.  If it is truly low then we will consider doing the additional work-up.

## 2020-04-18 ENCOUNTER — Ambulatory Visit
Admission: RE | Admit: 2020-04-18 | Discharge: 2020-04-18 | Disposition: A | Discharge status: Home / Self Care | Payer: No Typology Code available for payment source | Source: Ambulatory Visit | Attending: Obstetrics and Gynecology | Admitting: Obstetrics and Gynecology

## 2020-04-18 ENCOUNTER — Other Ambulatory Visit: Payer: Self-pay

## 2020-04-18 ENCOUNTER — Ambulatory Visit: Payer: No Typology Code available for payment source

## 2020-04-18 ENCOUNTER — Telehealth: Payer: Self-pay | Admitting: Hematology and Oncology

## 2020-04-18 DIAGNOSIS — N6452 Nipple discharge: Secondary | ICD-10-CM

## 2020-04-18 NOTE — Telephone Encounter (Signed)
No 10/5 los, no changes made to pt schedule

## 2020-04-23 ENCOUNTER — Telehealth: Payer: Self-pay

## 2020-04-23 NOTE — Telephone Encounter (Signed)
Call placed to convey benefits for Mirena. Spoke with the patient and conveyed the benefits. Patient understands/agreeable with the benefits. Patient is aware of the cancellation policy. Appointment scheduled 04/30/20@3pm  with Dr. Oscar La.

## 2020-04-26 NOTE — Telephone Encounter (Signed)
Patient cancelled iud insertion because of family emergency. She will call back to reschedule.

## 2020-04-30 ENCOUNTER — Ambulatory Visit: Payer: No Typology Code available for payment source | Admitting: Obstetrics and Gynecology

## 2020-05-08 ENCOUNTER — Other Ambulatory Visit: Payer: Self-pay | Admitting: Physician Assistant

## 2020-05-08 ENCOUNTER — Encounter: Payer: Self-pay | Admitting: Physician Assistant

## 2020-05-08 ENCOUNTER — Other Ambulatory Visit: Payer: Self-pay

## 2020-05-08 ENCOUNTER — Ambulatory Visit (INDEPENDENT_AMBULATORY_CARE_PROVIDER_SITE_OTHER): Payer: No Typology Code available for payment source | Admitting: Physician Assistant

## 2020-05-08 VITALS — BP 135/97 | HR 90 | Temp 98.1°F | Ht 62.75 in | Wt 184.2 lb

## 2020-05-08 DIAGNOSIS — Z23 Encounter for immunization: Secondary | ICD-10-CM

## 2020-05-08 DIAGNOSIS — Z0000 Encounter for general adult medical examination without abnormal findings: Secondary | ICD-10-CM

## 2020-05-08 DIAGNOSIS — I10 Essential (primary) hypertension: Secondary | ICD-10-CM

## 2020-05-08 MED ORDER — LISINOPRIL 5 MG PO TABS: 5.0000 mg | ORAL_TABLET | Freq: Every day | ORAL | 1 refills | Status: DC

## 2020-05-08 NOTE — Patient Instructions (Addendum)

## 2020-05-08 NOTE — Progress Notes (Signed)
Female Physical   Impression and Recommendations:    1. Healthcare maintenance   2. Need for influenza vaccination   3. Hypertension, unspecified type      1) Anticipatory Guidance: Discussed skin CA prevention and sunscreen when outside along with skin surveillance; eating a balanced and modest diet; physical activity at least 25 minutes per day or minimum of 150 min/ week moderate to intense activity.  2) Immunizations / Screenings / Labs:   All immunizations are up-to-date per recommendations or will be updated today if pt allows.    - Patient understands with dental and vision screens they will schedule independently.  - Obtained CBC, CMP, HgA1c, Lipid panel, and TSH when fasting. Patient has been evaluated by hematology for thrombocytopenia. - UTD on mammogram, pap smear, Tdap. - Agreeable to influenza vaccine.  3) Weight:  Discussed goal to improve diet habits to improve overall feelings of well being and objective health data. Improve nutrient density of diet through increasing intake of fruits and vegetables and decreasing saturated fats, white flour products and refined sugars.  4) Healthcare Maintenance: -BP elevated today and was elevated at consultation with Hematologist- Dr. Pamelia Hoit so will start low dose of Lisinopril. Continue with ambulatory BP and pulse monitoring. Advised to let me know if BP consistently above 130/80 and will increase dose to 10 mg. -Follow a heart healthy diet. -Stay as active as possible. -Recommend to reduce tobacco use. -Follow up in 4-5 months for reg OV: HTN  Meds ordered this encounter  Medications  . lisinopril (ZESTRIL) 5 MG tablet    Sig: Take 1 tablet (5 mg total) by mouth daily.    Dispense:  90 tablet    Refill:  1    Order Specific Question:   Supervising Provider    Answer:   Nani Gasser D [[id number]]    Orders Placed This Encounter  Procedures  . Flu Vaccine QUAD 36+ mos IM     Return for HTN- started med,  in 4-5  months.     Gross side effects, risk and benefits, and alternatives of medications discussed with patient.  Patient is aware that all medications have potential side effects and we are unable to predict every side effect or drug-drug interaction that may occur.  Expresses verbal understanding and consents to current therapy plan and treatment regimen.  F-up preventative CPE in 1 year-this is in addition to any chronic care visits.    Please see orders placed and AVS handed out to patient at the end of our visit for further patient instructions/ counseling done pertaining to today's office visit.   Note:  This note was prepared with assistance of Dragon voice recognition software. Occasional wrong-word or sound-a-like substitutions may have occurred due to the inherent limitations of voice recognition software.    Subjective:     CPE HPI: Claire King is a 44 y.o. female who presents to [facility] today a yearly health maintenance exam.   Health Maintenance Summary  - Reviewed and updated, unless pt declines services.  Tobacco History Reviewed:  Y, current smoker, 1.2 PPD Alcohol and/or drug use:    No concerns; no use Eye exams: N Dermatology home: N  Female Health:  PAP Smear - last known results:  04/18/20- normal, Followed by OB-GYN STD concerns:   none Birth control method: planning to have IUD Menses regular:  AUB Lumps or breast concerns:  none Breast Cancer Family History:  No    Additional  concerns beyond health maintenance issues: none     Immunization History  Administered Date(s) Administered  . Influenza,inj,Quad PF,6+ Mos 05/08/2020     Health Maintenance  Topic Date Due  . Hepatitis C Screening  Never done  . COVID-19 Vaccine (1) Never done  . HIV Screening  Never done  . TETANUS/TDAP  Never done  . PAP SMEAR-Modifier  03/29/2023  . INFLUENZA VACCINE  Completed     Wt Readings from Last 3 Encounters:   05/08/20 184 lb 3.2 oz (83.6 kg)  04/16/20 182 lb (82.6 kg)  04/16/20 182 lb 3.2 oz (82.6 kg)   BP Readings from Last 3 Encounters:  05/08/20 (!) 135/97  04/16/20 122/64  04/16/20 (!) 140/98   Pulse Readings from Last 3 Encounters:  05/08/20 90  04/16/20 87  04/16/20 97     Past Medical History:  Diagnosis Date  . Abnormal uterine bleeding   . Infertility, female       Past Surgical History:  Procedure Laterality Date  . kidney stone with stent        Family History  Problem Relation Age of Onset  . Lung cancer Maternal Grandmother       Social History   Substance and Sexual Activity  Drug Use Never  ,   Social History   Substance and Sexual Activity  Alcohol Use Never  ,   Social History   Tobacco Use  Smoking Status Current Every Day Smoker  . Packs/day: 0.50  . Years: 15.00  . Pack years: 7.50  . Types: Cigarettes  Smokeless Tobacco Never Used  ,   Social History   Substance and Sexual Activity  Sexual Activity Yes  . Birth control/protection: None   Comment: infertility    Current Outpatient Medications on File Prior to Visit  Medication Sig Dispense Refill  . fluticasone (FLONASE) 50 MCG/ACT nasal spray Place 2 sprays into both nostrils daily as needed for allergies or rhinitis. 15.8 mL 2   No current facility-administered medications on file prior to visit.    Allergies: Patient has no known allergies.  Review of Systems: General:   Denies fever, chills, unexplained weight loss.  Optho/Auditory:   Denies visual changes, blurred vision/LOV Respiratory:   Denies SOB, DOE more than baseline levels.   Cardiovascular:   Denies chest pain, palpitations, new onset peripheral edema  Gastrointestinal:   Denies nausea, vomiting, diarrhea.  Genitourinary: Denies dysuria, freq/ urgency, flank pain or discharge from genitals.  Endocrine:     Denies hot or cold intolerance, polyuria, polydipsia. Musculoskeletal:   Denies unexplained  myalgias, joint swelling, unexplained arthralgias, gait problems.  Skin:  Denies rash, suspicious lesions Neurological:     Denies dizziness, unexplained weakness, numbness +headache Psychiatric/Behavioral:   Denies mood changes, suicidal or homicidal ideations, hallucinations    Objective:    Blood pressure (!) 135/97, pulse 90, temperature 98.1 F (36.7 C), temperature source Oral, height 5' 2.75" (1.594 m), weight 184 lb 3.2 oz (83.6 kg), last menstrual period 04/11/2020, SpO2 98 %. Body mass index is 32.89 kg/m. General Appearance:    Alert, cooperative, no distress, appears stated age  Head:    Normocephalic, without obvious abnormality, atraumatic  Eyes:    PERRL, conjunctiva/corneas clear, EOM's intact, both eyes  Ears:    Normal TM's and external ear canals, both ears  Nose:   Nares normal, septum midline, mucosa normal, no drainage    or sinus tenderness  Throat:   Lips w/o lesion, mucosa moist, and  tongue normal; teeth and   gums normal  Neck:   Supple, symmetrical, trachea midline, no adenopathy;    thyroid:  no enlargement/tenderness/nodules;  Back:     Symmetric, no curvature, ROM normal, no CVA tenderness  Lungs:     Clear to auscultation bilaterally, respirations unlabored, no       Wh/ R/ R  Chest Wall:    No tenderness or gross deformity; normal excursion   Heart:    Regular rate and rhythm, S1 and S2 normal, no murmur  Breast Exam:   Deferred to OB/GYN  Abdomen:     Soft, non-tender, bowel sounds active all four quadrants, No   G/R/R, no masses, no organomegaly  Genitalia:    Deferred to OB/GYN.  Rectal:    Deferred to OB/GYN  Extremities:   Extremities normal, atraumatic, no cyanosis or gross edema  Pulses:   2+ and symmetric all extremities  Skin:   Warm, dry, Skin color, texture, turgor normal, no obvious rashes or lesions Psych: No HI/SI, judgement and insight good, Euthymic mood. Full Affect.  Neurologic:   CNII-XII grossly intact, normal strength, sensation  and reflexes throughout

## 2020-09-11 ENCOUNTER — Ambulatory Visit: Payer: No Typology Code available for payment source | Admitting: Physician Assistant

## 2021-02-24 ENCOUNTER — Encounter: Payer: Self-pay | Admitting: Obstetrics and Gynecology

## 2021-02-25 ENCOUNTER — Other Ambulatory Visit: Payer: Self-pay

## 2021-02-25 MED FILL — Lisinopril Tab 5 MG: ORAL | 90 days supply | Qty: 90 | Fill #0 | Status: AC

## 2021-04-03 ENCOUNTER — Ambulatory Visit: Payer: No Typology Code available for payment source | Admitting: Obstetrics and Gynecology

## 2021-05-07 ENCOUNTER — Other Ambulatory Visit: Payer: Self-pay

## 2021-05-07 MED ORDER — LEVOFLOXACIN 500 MG PO TABS
ORAL_TABLET | ORAL | 0 refills | Status: DC
  Filled 2021-05-07: qty 7, 7d supply, fill #0

## 2021-07-02 ENCOUNTER — Other Ambulatory Visit: Payer: Self-pay | Admitting: Physician Assistant

## 2021-07-02 ENCOUNTER — Other Ambulatory Visit: Payer: Self-pay

## 2021-07-02 DIAGNOSIS — I10 Essential (primary) hypertension: Secondary | ICD-10-CM

## 2021-07-02 MED ORDER — LISINOPRIL 5 MG PO TABS
5.0000 mg | ORAL_TABLET | Freq: Every day | ORAL | 0 refills | Status: DC
  Filled 2021-07-02: qty 90, 90d supply, fill #0

## 2021-07-30 ENCOUNTER — Other Ambulatory Visit: Payer: Self-pay

## 2021-07-30 MED ORDER — PREVIDENT 5000 BOOSTER PLUS 1.1 % DT PSTE
PASTE | DENTAL | 3 refills | Status: DC
  Filled 2021-07-30: qty 100, 30d supply, fill #0

## 2021-12-10 IMAGING — MR MR HEAD WO/W CM
19 series · 48 of 48 positions shown · IV contrast (8ml Multihance)
Comparison: None.

CLINICAL DATA: Nipple discharge, elevated prolactin level.

EXAM:
MRI HEAD WITHOUT AND WITH CONTRAST
TECHNIQUE: Multiplanar, multiecho pulse sequences of the brain and surrounding
structures were obtained without and with intravenous contrast.
CONTRAST:  10mL MULTIHANCE GADOBENATE DIMEGLUMINE 529 MG/ML IV SOLN

[Series 5: T1 · sagittal · 4.0mm · 0.72mm/px · 1 of 27 slices shown (1 of 5)]
[im 1/27]
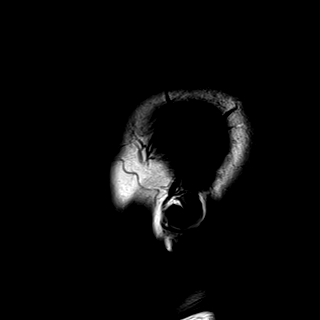

[Series 6: DWI · axial · 3.0mm · 1.44mm/px · z∈[-84,+75]mm · 6 of 84 slices shown (1 of 2)]
[im 1/84]
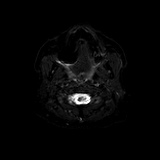
[im 17/84]
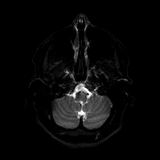
[im 34/84]
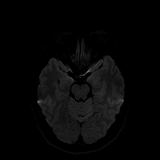
[im 50/84]
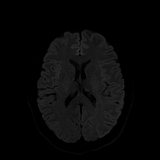
[im 67/84]
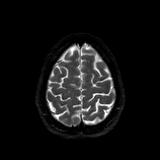
[im 84/84]
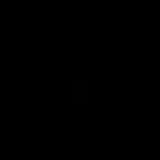

[Series 7: DWI · axial · 3.0mm · 1.44mm/px · z∈[-84,+75]mm · 3 of 41 slices shown (2 of 2)]
[im 1/41]
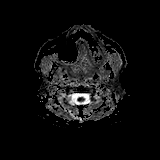
[im 21/41]
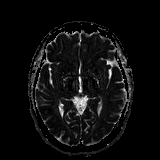
[im 41/41]
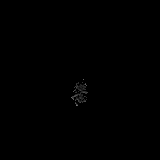

[Series 8: T2 · axial · 4.0mm · 0.36mm/px · z∈[-71,+64]mm · 2 of 27 slices shown]
[im 1/27]
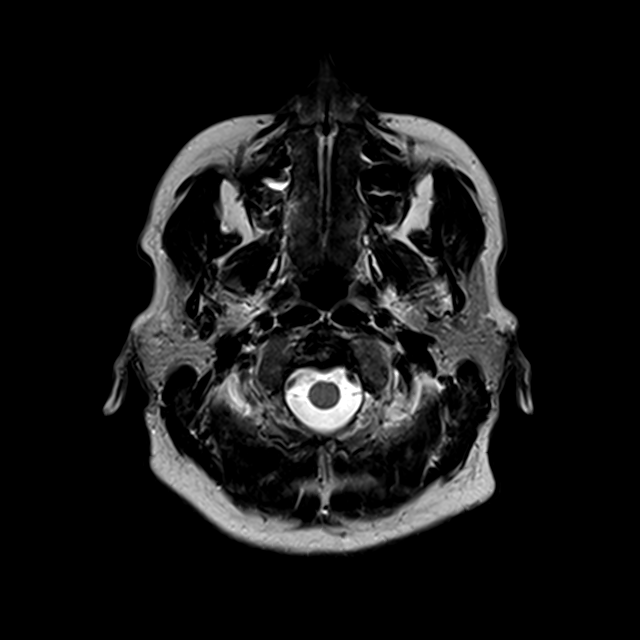
[im 27/27]
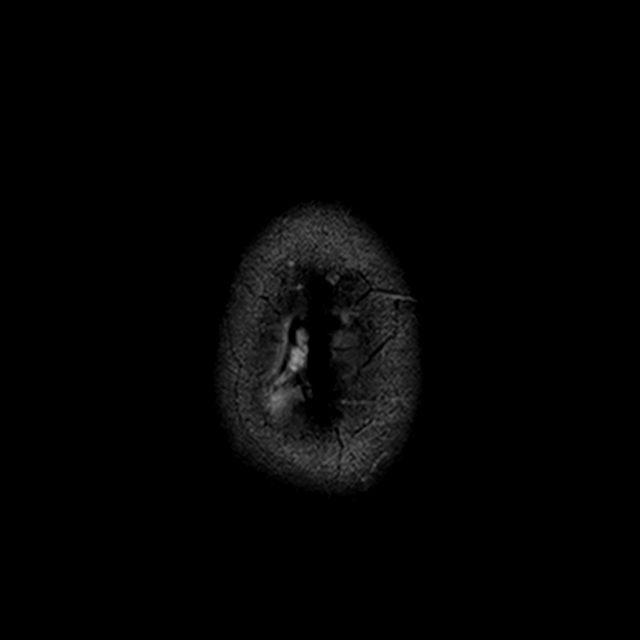

[Series 10: swi_images · axial · 1.5mm · 0.90mm/px · z∈[-81,+60]mm · 8 of 96 slices shown]
[im 1/96]
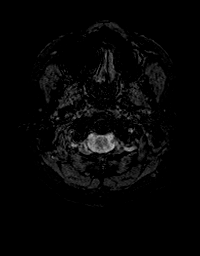
[im 14/96]
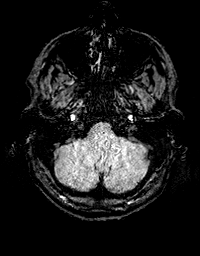
[im 28/96]
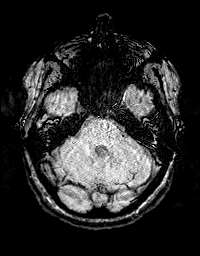
[im 41/96]
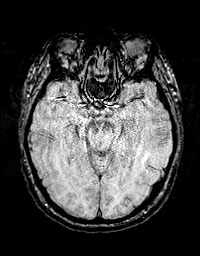
[im 55/96]
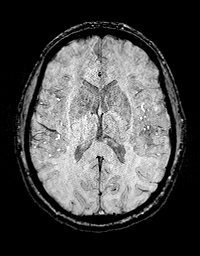
[im 68/96]
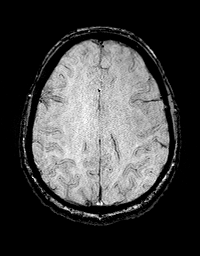
[im 82/96]
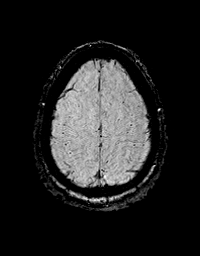
[im 96/96]
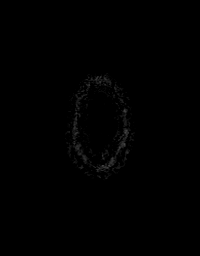

[Series 11: FLAIR · axial · 3.0mm · 0.72mm/px · z∈[-73,+65]mm · 2 of 24 slices shown]
[im 1/24]
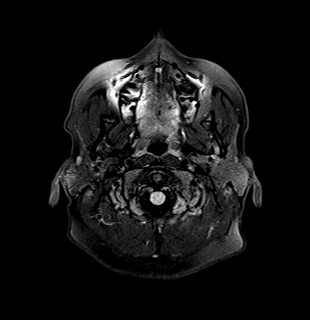
[im 24/24]
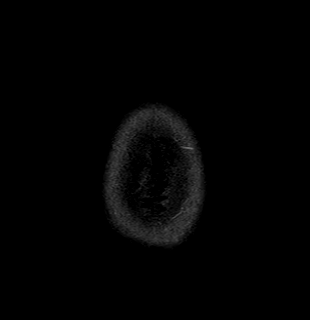

[Series 12: T1 · sagittal · 3.0mm · 0.42mm/px · 1 of 13 slices shown (2 of 5)]
[im 1/13]
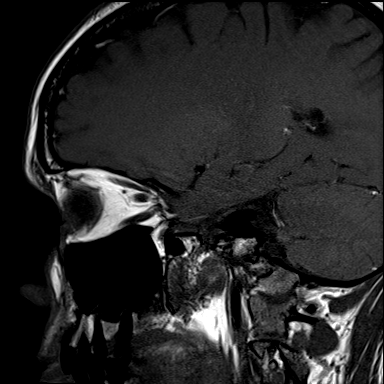

[Series 13: T1 · coronal · 3.0mm · 0.42mm/px · 1 of 10 slices shown (3 of 5)]
[im 1/10]
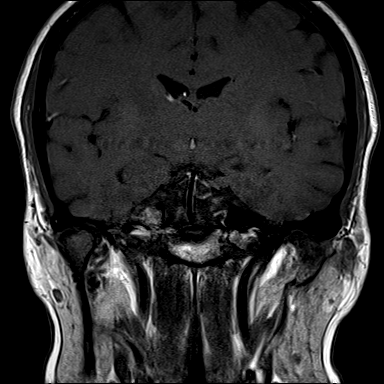

[Series 14: T1 · coronal · non-contrast · 3.0mm · 0.62mm/px · 1 of 9 slices shown (4 of 5)]
[im 1/9]
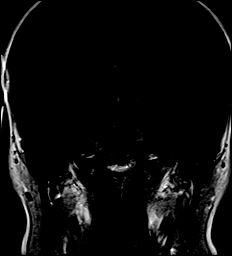

[Series 15: cor post dyn · coronal · 3.0mm · 0.62mm/px · 1 of 9 slices shown (1 of 6)]
[im 1/9]
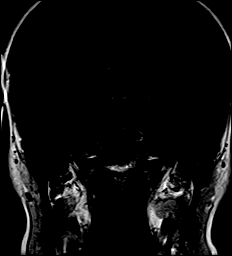

[Series 16: cor post dyn · coronal · 3.0mm · 0.62mm/px · 1 of 9 slices shown (2 of 6)]
[im 1/9]
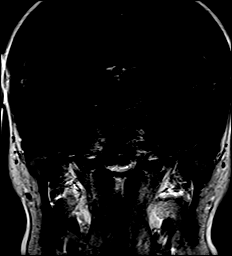

[Series 17: cor post dyn · coronal · 3.0mm · 0.62mm/px · 1 of 9 slices shown (3 of 6)]
[im 1/9]
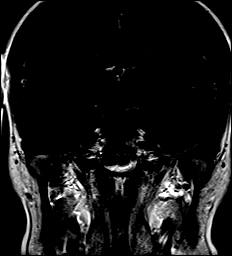

[Series 18: cor post dyn · coronal · 3.0mm · 0.62mm/px · 1 of 9 slices shown (4 of 6)]
[im 1/9]
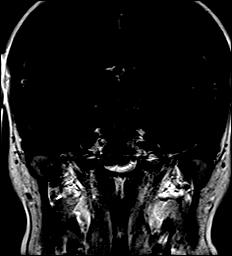

[Series 19: cor post dyn · coronal · 3.0mm · 0.62mm/px · 1 of 9 slices shown (5 of 6)]
[im 1/9]
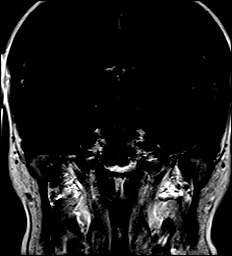

[Series 20: cor post dyn · coronal · 3.0mm · 0.62mm/px · 1 of 9 slices shown (6 of 6)]
[im 1/9]
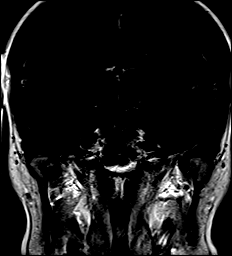

[Series 21: T1 post-contrast · sagittal · 3.0mm · 0.42mm/px · 1 of 13 slices shown (1 of 3)]
[im 1/13]
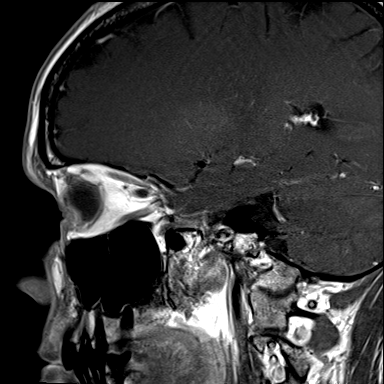

[Series 22: T1 post-contrast · coronal · 3.0mm · 0.42mm/px · 1 of 10 slices shown (2 of 3)]
[im 1/10]
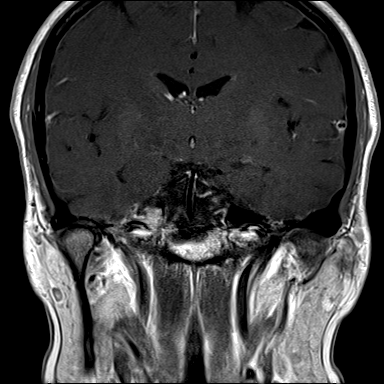

[Series 23: T1 · axial · 1.0mm · 0.86mm/px · z∈[-88,+54]mm · 12 of 144 slices shown (5 of 5)]
[im 1/144]
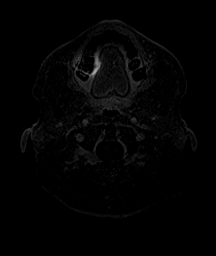
[im 14/144]
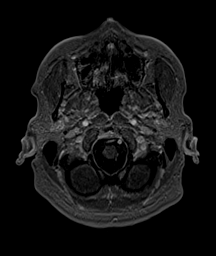
[im 27/144]
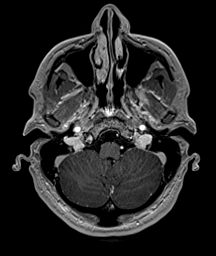
[im 40/144]
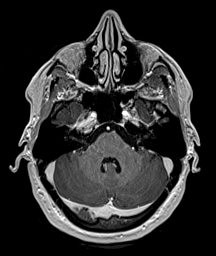
[im 53/144]
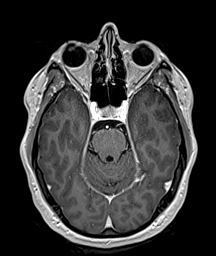
[im 66/144]
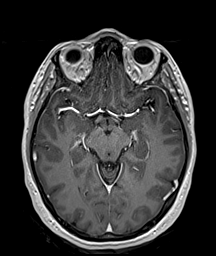
[im 79/144]
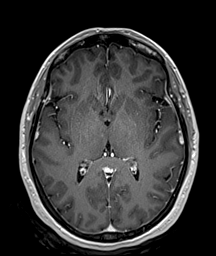
[im 92/144]
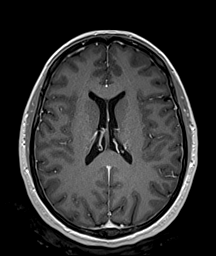
[im 105/144]
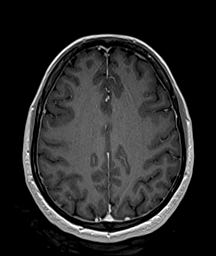
[im 118/144]
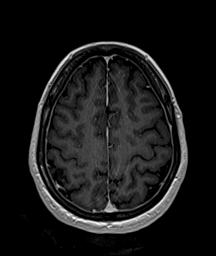
[im 131/144]
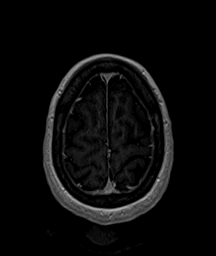
[im 144/144]
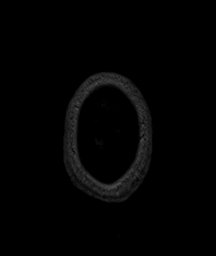

[Series 24: T1 post-contrast · coronal · 4.0mm · 0.47mm/px · 3 of 34 slices shown (3 of 3)]
[im 1/34]
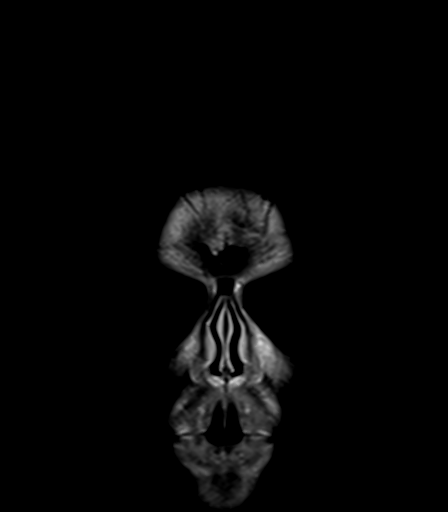
[im 17/34]
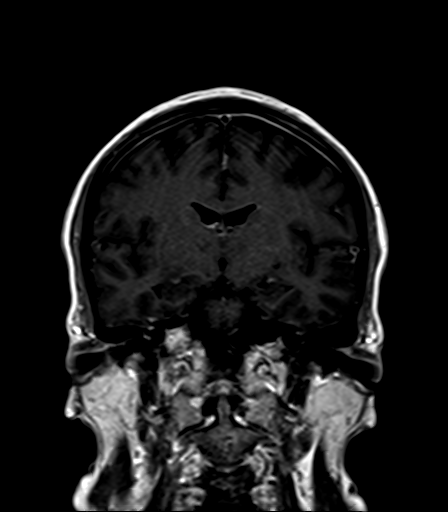
[im 34/34]
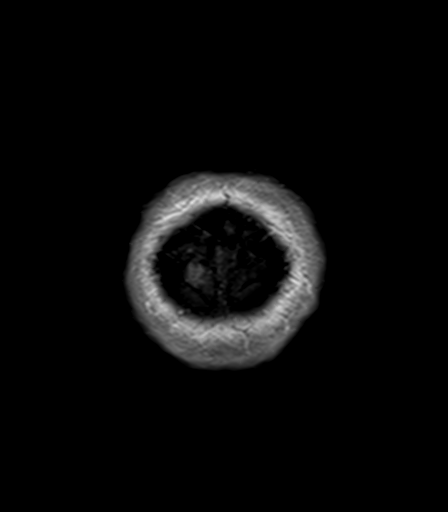

[48 of 48 positions shown; findings below may reference images not displayed]

FINDINGS: Brain: No acute infarction, hemorrhage, hydrocephalus, extra-axial
collection or mass lesion.

Rare foci of T2 hyperintensity are seen within the white the
cerebral hemispheres nonspecific.

Pituitary/Sella: The pituitary gland is normal in appearance without
mass lesion. A normal posterior pituitary bright spot is seen. The
infundibulum is midline. The hypothalamus and mamillary bodies are
normal. There is no mass effect on the optic chiasm or optic nerves.
The infundibular and chiasmatic recesses are clear. Normal cavernous
sinus and cavernous internal carotid artery flow voids.

No focus of abnormal contrast enhancement identified.

Vascular: Normal flow voids.

Skull and upper cervical spine: Normal marrow signal.

Sinuses/Orbits: Negative.

Other: None.
IMPRESSION: 1. Normal MRI of the pituitary gland.
2. Rare foci of T2 hyperintensity within the white matter the
cerebral hemispheres, nonspecific. Differential considerations
include migraine headache, early chronic microvascular ischemic
changes, and post infectious/inflammatory processes.

## 2022-10-15 ENCOUNTER — Other Ambulatory Visit: Payer: Self-pay

## 2022-10-16 ENCOUNTER — Other Ambulatory Visit: Payer: Self-pay

## 2022-10-16 MED ORDER — LISINOPRIL 5 MG PO TABS
5.0000 mg | ORAL_TABLET | Freq: Every day | ORAL | 1 refills | Status: DC
  Filled 2022-10-16: qty 90, 90d supply, fill #0

## 2022-11-06 ENCOUNTER — Ambulatory Visit: Payer: 59 | Admitting: Nurse Practitioner

## 2022-12-10 ENCOUNTER — Other Ambulatory Visit (HOSPITAL_COMMUNITY): Payer: Self-pay

## 2023-01-11 NOTE — Progress Notes (Unsigned)
   There were no vitals taken for this visit.   Subjective:    Patient ID: Claire King, female    DOB: 22-Jul-1975, 47 y.o.   MRN: [id number]  HPI: Claire King is a 47 y.o. female  No chief complaint on file.  Establish care: her last physical was ***.  Medical history includes ***.  Family history includes ***.  Health maintenance ***.   Relevant past medical, surgical, family and social history reviewed and updated as indicated. Interim medical history since our last visit reviewed. Allergies and medications reviewed and updated.  Review of Systems  Constitutional: Negative for fever or weight change.  Respiratory: Negative for cough and shortness of breath.   Cardiovascular: Negative for chest pain or palpitations.  Gastrointestinal: Negative for abdominal pain, no bowel changes.  Musculoskeletal: Negative for gait problem or joint swelling.  Skin: Negative for rash.  Neurological: Negative for dizziness or headache.  No other specific complaints in a complete review of systems (except as listed in HPI above).      Objective:    There were no vitals taken for this visit.  Wt Readings from Last 3 Encounters:  05/08/20 184 lb 3.2 oz (83.6 kg)  04/16/20 182 lb (82.6 kg)  04/16/20 182 lb 3.2 oz (82.6 kg)    Physical Exam  Constitutional: Patient appears well-developed and well-nourished. Obese *** No distress.  HEENT: head atraumatic, normocephalic, pupils equal and reactive to light, ears ***, neck supple, throat within normal limits Cardiovascular: Normal rate, regular rhythm and normal heart sounds.  No murmur heard. No BLE edema. Pulmonary/Chest: Effort normal and breath sounds normal. No respiratory distress. Abdominal: Soft.  There is no tenderness. Psychiatric: Patient has a normal mood and affect. behavior is normal. Judgment and thought content normal.   Results for orders placed or performed in visit on 04/16/20  CBC with Differential ([facility]  Only)  Result Value Ref Range   WBC Count 3.6 (L) 4.0 - 10.5 K/uL   RBC 4.66 3.87 - 5.11 MIL/uL   Hemoglobin 13.9 12.0 - 15.0 g/dL   HCT 40.9 81.1 - 91.4 %   MCV 89.1 80.0 - 100.0 fL   MCH 29.8 26.0 - 34.0 pg   MCHC 33.5 30.0 - 36.0 g/dL   RDW 78.2 95.6 - 21.3 %   Platelet Count 247 150 - 400 K/uL   nRBC 0.0 0.0 - 0.2 %   Neutrophils Relative % 50 %   Neutro Abs 1.8 1.7 - 7.7 K/uL   Lymphocytes Relative 30 %   Lymphs Abs 1.1 0.7 - 4.0 K/uL   Monocytes Relative 12 %   Monocytes Absolute 0.4 0.1 - 1.0 K/uL   Eosinophils Relative 7 %   Eosinophils Absolute 0.2 0.0 - 0.5 K/uL   Basophils Relative 1 %   Basophils Absolute 0.0 0.0 - 0.1 K/uL   Immature Granulocytes 0 %   Abs Immature Granulocytes 0.00 0.00 - 0.07 K/uL  Platelet by Citrate  Result Value Ref Range   Platelet CT in Citrate EDTA platelet count consistent with citrate.       Assessment & Plan:   Problem List Items Addressed This Visit   None    Follow up plan: No follow-ups on file.

## 2023-01-12 ENCOUNTER — Ambulatory Visit (INDEPENDENT_AMBULATORY_CARE_PROVIDER_SITE_OTHER): Payer: Self-pay | Admitting: Nurse Practitioner

## 2023-01-12 ENCOUNTER — Other Ambulatory Visit: Payer: Self-pay

## 2023-01-12 ENCOUNTER — Encounter: Payer: Self-pay | Admitting: Nurse Practitioner

## 2023-01-12 VITALS — BP 128/84 | HR 93 | Temp 98.3°F | Resp 18 | Ht 63.0 in | Wt 183.9 lb

## 2023-01-12 DIAGNOSIS — Z6832 Body mass index (BMI) 32.0-32.9, adult: Secondary | ICD-10-CM

## 2023-01-12 DIAGNOSIS — I10 Essential (primary) hypertension: Secondary | ICD-10-CM | POA: Diagnosis not present

## 2023-01-12 DIAGNOSIS — Z1159 Encounter for screening for other viral diseases: Secondary | ICD-10-CM | POA: Diagnosis not present

## 2023-01-12 DIAGNOSIS — M79642 Pain in left hand: Secondary | ICD-10-CM

## 2023-01-12 DIAGNOSIS — E66811 Obesity, class 1: Secondary | ICD-10-CM

## 2023-01-12 DIAGNOSIS — E6609 Other obesity due to excess calories: Secondary | ICD-10-CM

## 2023-01-12 DIAGNOSIS — Z13220 Encounter for screening for lipoid disorders: Secondary | ICD-10-CM | POA: Diagnosis not present

## 2023-01-12 DIAGNOSIS — B070 Plantar wart: Secondary | ICD-10-CM

## 2023-01-12 DIAGNOSIS — Z114 Encounter for screening for human immunodeficiency virus [HIV]: Secondary | ICD-10-CM | POA: Diagnosis not present

## 2023-01-12 DIAGNOSIS — Z130 Encounter for screening for diseases of the blood and blood-forming organs and certain disorders involving the immune mechanism: Secondary | ICD-10-CM | POA: Diagnosis not present

## 2023-01-12 DIAGNOSIS — Z7689 Persons encountering health services in other specified circumstances: Secondary | ICD-10-CM

## 2023-01-12 DIAGNOSIS — Z1211 Encounter for screening for malignant neoplasm of colon: Secondary | ICD-10-CM

## 2023-01-12 DIAGNOSIS — L989 Disorder of the skin and subcutaneous tissue, unspecified: Secondary | ICD-10-CM

## 2023-01-12 DIAGNOSIS — Z131 Encounter for screening for diabetes mellitus: Secondary | ICD-10-CM

## 2023-01-12 DIAGNOSIS — M79641 Pain in right hand: Secondary | ICD-10-CM

## 2023-01-12 MED ORDER — LISINOPRIL 5 MG PO TABS
5.0000 mg | ORAL_TABLET | Freq: Every day | ORAL | 1 refills | Status: DC
Start: 2023-01-12 — End: 2024-03-07
  Filled 2023-01-12: qty 90, 90d supply, fill #0

## 2023-01-12 NOTE — Assessment & Plan Note (Addendum)
Recommend being consistent with taking lisinopril 5 mg daily

## 2023-01-12 NOTE — Assessment & Plan Note (Signed)
Patient has an active job as a Facilities manager.  Recommend increasing physical activity outside of work like walking.  Eat well balanced diet with portion control.

## 2023-01-13 ENCOUNTER — Other Ambulatory Visit: Payer: Self-pay

## 2023-01-13 ENCOUNTER — Encounter: Payer: Self-pay | Admitting: Nurse Practitioner

## 2023-01-13 ENCOUNTER — Other Ambulatory Visit: Payer: Self-pay | Admitting: Nurse Practitioner

## 2023-01-13 DIAGNOSIS — I10 Essential (primary) hypertension: Secondary | ICD-10-CM

## 2023-01-13 DIAGNOSIS — M1990 Unspecified osteoarthritis, unspecified site: Secondary | ICD-10-CM

## 2023-01-13 LAB — CBC WITH DIFFERENTIAL/PLATELET
Absolute Monocytes: 307 cells/uL (ref 200–950)
Basophils Absolute: 29 cells/uL (ref 0–200)
Basophils Relative: 0.9 %
Eosinophils Absolute: 189 cells/uL (ref 15–500)
Eosinophils Relative: 5.9 %
HCT: 45 % (ref 35.0–45.0)
Hemoglobin: 15.1 g/dL (ref 11.7–15.5)
Lymphs Abs: 794 cells/uL — ABNORMAL LOW (ref 850–3900)
MCH: 30.4 pg (ref 27.0–33.0)
MCHC: 33.6 g/dL (ref 32.0–36.0)
MCV: 90.7 fL (ref 80.0–100.0)
MPV: 10.2 fL (ref 7.5–12.5)
Monocytes Relative: 9.6 %
Neutro Abs: 1882 cells/uL (ref 1500–7800)
Neutrophils Relative %: 58.8 %
Platelets: 87 10*3/uL — ABNORMAL LOW (ref 140–400)
RBC: 4.96 10*6/uL (ref 3.80–5.10)
RDW: 13.2 % (ref 11.0–15.0)
Total Lymphocyte: 24.8 %
WBC: 3.2 10*3/uL — ABNORMAL LOW (ref 3.8–10.8)

## 2023-01-13 LAB — COMPLETE METABOLIC PANEL WITH GFR
AG Ratio: 1.7 (calc) (ref 1.0–2.5)
ALT: 28 U/L (ref 6–29)
AST: 22 U/L (ref 10–35)
Albumin: 4.5 g/dL (ref 3.6–5.1)
Alkaline phosphatase (APISO): 97 U/L (ref 31–125)
BUN: 15 mg/dL (ref 7–25)
CO2: 30 mmol/L (ref 20–32)
Calcium: 9.8 mg/dL (ref 8.6–10.2)
Chloride: 104 mmol/L (ref 98–110)
Creat: 0.75 mg/dL (ref 0.50–0.99)
Globulin: 2.7 g/dL (calc) (ref 1.9–3.7)
Glucose, Bld: 102 mg/dL — ABNORMAL HIGH (ref 65–99)
Potassium: 4.8 mmol/L (ref 3.5–5.3)
Sodium: 142 mmol/L (ref 135–146)
Total Bilirubin: 0.6 mg/dL (ref 0.2–1.2)
Total Protein: 7.2 g/dL (ref 6.1–8.1)
eGFR: 99 mL/min/{1.73_m2} (ref >=60)

## 2023-01-13 LAB — HIV ANTIBODY (ROUTINE TESTING W REFLEX): HIV 1&2 Ab, 4th Generation: NONREACTIVE

## 2023-01-13 LAB — HEMOGLOBIN A1C
Hgb A1c MFr Bld: 5.5 %{Hb}
Mean Plasma Glucose: 111 mg/dL
eAG (mmol/L): 6.2 mmol/L

## 2023-01-13 LAB — RHEUMATOID FACTOR: Rheumatoid fact SerPl-aCnc: <10 IU/mL (ref <14)

## 2023-01-13 LAB — LIPID PANEL
Cholesterol: 163 mg/dL
HDL: 55 mg/dL
LDL Cholesterol (Calc): 84 mg/dL
Non-HDL Cholesterol (Calc): 108 mg/dL
Total CHOL/HDL Ratio: 3 (calc)
Triglycerides: 137 mg/dL

## 2023-01-13 LAB — HEPATITIS C ANTIBODY: Hepatitis C Ab: NONREACTIVE

## 2023-01-13 MED ORDER — MELOXICAM 7.5 MG PO TABS
7.5000 mg | ORAL_TABLET | Freq: Every day | ORAL | 1 refills | Status: DC
Start: 2023-01-13 — End: 2024-03-07
  Filled 2023-01-13: qty 90, 90d supply, fill #0

## 2023-02-02 ENCOUNTER — Ambulatory Visit: Payer: 59 | Admitting: Podiatry

## 2023-02-02 ENCOUNTER — Encounter: Payer: Self-pay | Admitting: Podiatry

## 2023-02-02 VITALS — BP 129/94 | HR 73

## 2023-02-02 DIAGNOSIS — B070 Plantar wart: Secondary | ICD-10-CM | POA: Diagnosis not present

## 2023-02-02 NOTE — Progress Notes (Signed)
   Chief Complaint  Patient presents with   Plantar Warts    "I've got I assume a Plantar Wart on my toe." N - painful plantar wart  L - hallux right D - 5 months O - suddenly, about the same C - sharp pain A - step on it T - Compound W, Medicated corn pads    Subjective: 47 y.o. female presenting today as a new patient for evaluation of a symptomatic skin lesion to the plantar aspect of the right great toe present for about 5 months.  She has tried different topicals including Compound W and medicated corn pads.  Presenting for further treatment and evaluation   Past Medical History:  Diagnosis Date   Abnormal uterine bleeding    Allergy    Hypertension    Infertility, female    Kidney stones     Objective: Physical Exam General: The patient is alert and oriented x3 in no acute distress.   Dermatology: Hyperkeratotic skin lesion(s) noted to the plantar aspect of the right foot approximately 1 cm in diameter. Pinpoint bleeding noted upon debridement. Skin is warm, dry and supple bilateral lower extremities. Negative for open lesions or macerations.   Vascular: Palpable pedal pulses bilaterally. No edema or erythema noted. Capillary refill within normal limits.   Neurological: Grossly intact via light touch   Musculoskeletal Exam: Pain on palpation to the noted skin lesion(s).  Range of motion within normal limits to all pedal and ankle joints bilateral. Muscle strength 5/5 in all groups bilateral.    Assessment: #1 plantar wart right foot    Plan of Care:  #1 Patient was evaluated. #2 Excisional debridement of the plantar wart lesion(s) was performed using a chisel blade. Cantharone was applied and the lesion(s) was dressed with a dry sterile dressing. #3 patient is to return to clinic in 3 weeks  *In school to get her Masters in Orthoptist. Works at [employer]     Felecia Shelling, DPM [facility]  Dr. Felecia Shelling, DPM    [address]                                        [place], [place] [address]                Office [phone]  Fax [phone]

## 2023-02-09 DIAGNOSIS — Z1211 Encounter for screening for malignant neoplasm of colon: Secondary | ICD-10-CM | POA: Diagnosis not present

## 2023-02-15 ENCOUNTER — Encounter: Payer: Self-pay | Admitting: Nurse Practitioner

## 2023-02-17 NOTE — Progress Notes (Unsigned)
Name: Claire King   MRN: [id number]    DOB: 1976-05-18   Date:02/18/2023       Progress Note  Subjective  Chief Complaint  Chief Complaint  Patient presents with   Annual Exam    HPI  Patient presents for annual CPE.  Diet: Regular, well balanced diet Exercise: None, recommend 150 min of physical activity weekly   Last Eye Exam: 5 years Last Dental Exam: August 2024  Flowsheet Row Office Visit from 02/18/2023 in [facility]  AUDIT-C Score 0      Depression: Phq 9 is  negative    02/18/2023    7:41 AM 01/12/2023    8:14 AM 05/08/2020    3:01 PM 01/23/2020    2:08 PM  Depression screen PHQ 2/9  Decreased Interest 0 0 0 0  Down, Depressed, Hopeless 0 0 0 0  PHQ - 2 Score 0 0 0 0  Altered sleeping   0 0  Tired, decreased energy   0 0  Change in appetite   0 0  Feeling bad or failure about yourself    0 0  Trouble concentrating   0 0  Moving slowly or fidgety/restless   0 0  Suicidal thoughts   0 0  PHQ-9 Score   0 0   Hypertension: BP Readings from Last 3 Encounters:  02/18/23 122/84  02/02/23 (!) 129/94  01/12/23 128/84   Obesity: Wt Readings from Last 3 Encounters:  02/18/23 186 lb 3.2 oz (84.5 kg)  01/12/23 183 lb 14.4 oz (83.4 kg)  05/08/20 184 lb 3.2 oz (83.6 kg)   BMI Readings from Last 3 Encounters:  02/18/23 32.98 kg/m  01/12/23 32.58 kg/m  05/08/20 32.89 kg/m     Vaccines:  HPV: up to at age 71 , ask insurance if age between 25-45  Shingrix: 73-64 yo and ask insurance if covered when patient above 43 yo Pneumonia:  educated and discussed with patient. Flu:  educated and discussed with patient.     Hep C Screening: completed STD testing and prevention (HIV/chl/gon/syphilis): completed Intimate partner violence: negative screen  Sexual History : yes,  Menstrual History/LMP/Abnormal Bleeding: postmenopausal Discussed importance of follow up if any post-menopausal bleeding: yes  Incontinence Symptoms: negative  for symptoms   Breast cancer:  - Last Mammogram: 04/18/2020,  ordered - BRCA gene screening: none  Osteoporosis Prevention : Discussed high calcium and vitamin D supplementation, weight bearing exercises Bone density :no   Cervical cancer screening: 03/28/2020  Skin cancer: Discussed monitoring for atypical lesions  Colorectal cancer: 02/09/2023   Lung cancer:  Low Dose CT Chest recommended if Age 27-80 years, 20 pack-year currently smoking OR have quit w/in 15years. Patient does not qualify for screen   ECG: none  Advanced Care Planning: A voluntary discussion about advance care planning including the explanation and discussion of advance directives.  Discussed health care proxy and Living will, and the patient was able to identify a health care proxy as Claire King.  Patient does not have a living will and power of attorney of health care   Lipids: Lab Results  Component Value Date   CHOL 163 01/12/2023   CHOL 133 03/22/2020   Lab Results  Component Value Date   HDL 55 01/12/2023   HDL 44 03/22/2020   Lab Results  Component Value Date   LDLCALC 84 01/12/2023   LDLCALC 74 03/22/2020   Lab Results  Component Value Date   TRIG 137 01/12/2023  TRIG 72 03/22/2020   Lab Results  Component Value Date   CHOLHDL 3.0 01/12/2023   CHOLHDL 3.0 03/22/2020   No results found for: "LDLDIRECT"  Glucose: Glucose  Date Value Ref Range Status  03/22/2020 82 65 - 99 mg/dL Final   Glucose, Bld  Date Value Ref Range Status  01/12/2023 102 (H) 65 - 99 mg/dL Final    Comment:    .            Fasting reference interval . For someone without known diabetes, a glucose value between 100 and 125 mg/dL is consistent with prediabetes and should be confirmed with a follow-up test. .     Patient Active Problem List   Diagnosis Date Noted   Primary hypertension 01/12/2023   Class 1 obesity due to excess calories with serious comorbidity and body mass index (BMI) of 32.0 to  32.9 in adult 01/12/2023    Past Surgical History:  Procedure Laterality Date   kidney stone with stent      Family History  Problem Relation Age of Onset   Arthritis Mother    Lung cancer Maternal Grandmother     Social History   Socioeconomic History   Marital status: Married    Spouse name: Not on file   Number of children: 1   Years of education: Not on file   Highest education level: Not on file  Occupational History   Not on file  Tobacco Use   Smoking status: Every Day    Current packs/day: 0.50    Average packs/day: 0.5 packs/day for 15.0 years (7.5 ttl pk-yrs)    Types: Cigarettes   Smokeless tobacco: Never  Vaping Use   Vaping status: Never Used  Substance and Sexual Activity   Alcohol use: Never   Drug use: Never   Sexual activity: Yes    Birth control/protection: None    Comment: infertility  Other Topics Concern   Not on file  Social History Narrative   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 [employer]   Social Determinants of Health   Financial Resource Strain: Low Risk  (02/18/2023)   Overall Financial Resource Strain (CARDIA)    Difficulty of Paying Living Expenses: Not hard at all  Food Insecurity: No Food Insecurity (02/18/2023)   Hunger Vital Sign    Worried About Running Out of Food in the Last Year: Never true    Ran Out of Food in the Last Year: Never true  Transportation Needs: No Transportation Needs (02/18/2023)   PRAPARE - Administrator, Civil Service (Medical): No    Lack of Transportation (Non-Medical): No  Physical Activity: Inactive (02/18/2023)   Exercise Vital Sign    Days of Exercise per Week: 0 days    Minutes of Exercise per Session: 0 min  Stress: No Stress Concern Present (02/18/2023)   Claire King of Occupational Health - Occupational Stress Questionnaire    Feeling of Stress : Only a little  Social Connections: Moderately Integrated (02/18/2023)   Social Connection and Isolation Panel [NHANES]     Frequency of Communication with Friends and Family: More than three times a week    Frequency of Social Gatherings with Friends and Family: More than three times a week    Attends Religious Services: 1 to 4 times per year    Active Member of [employer] or Organizations: No    Attends Banker Meetings: Never    Marital Status: Married  Catering manager  Violence: Not At Risk (02/18/2023)   Humiliation, Afraid, Rape, and Kick questionnaire    Fear of Current or Ex-Partner: No    Emotionally Abused: No    Physically Abused: No    Sexually Abused: No     Current Outpatient Medications:    fluticasone (FLONASE) 50 MCG/ACT nasal spray, Place 2 sprays into both nostrils daily as needed for allergies or rhinitis., Disp: 15.8 mL, Rfl: 2   lisinopril (ZESTRIL) 5 MG tablet, Take 1 tablet (5 mg total) by mouth daily., Disp: 90 tablet, Rfl: 1   meloxicam (MOBIC) 7.5 MG tablet, Take 1 tablet (7.5 mg total) by mouth daily., Disp: 90 tablet, Rfl: 1   Turmeric (QC TUMERIC COMPLEX) 500 MG CAPS, , Disp: , Rfl:   No Known Allergies   ROS  Constitutional: Negative for fever or weight change.  Respiratory: Negative for cough and shortness of breath.   Cardiovascular: Negative for chest pain or palpitations.  Gastrointestinal: Negative for abdominal pain, no bowel changes.  Musculoskeletal: Negative for gait problem or joint swelling.  Skin: Negative for rash.  Neurological: Negative for dizziness or headache.  No other specific complaints in a complete review of systems (except as listed in HPI above).   Objective  Vitals:   02/18/23 0747  BP: 122/84  Pulse: 98  Resp: 16  Temp: 98.1 F (36.7 C)  TempSrc: Oral  SpO2: 98%  Weight: 186 lb 3.2 oz (84.5 kg)  Height: 5\' 3"  (1.6 m)    Body mass index is 32.98 kg/m.  Physical Exam Constitutional: Patient appears well-developed and well-nourished. No distress.  HENT: Head: Normocephalic and atraumatic. Ears: B TMs ok, no erythema or  effusion; Nose: Nose normal. Mouth/Throat: Oropharynx is clear and moist. No oropharyngeal exudate.  Eyes: Conjunctivae and EOM are normal. Pupils are equal, round, and reactive to light. No scleral icterus.  Neck: Normal range of motion. Neck supple. No JVD present. No thyromegaly present.  Cardiovascular: Normal rate, regular rhythm and normal heart sounds.  No murmur heard. No BLE edema. Pulmonary/Chest: Effort normal and breath sounds normal. No respiratory distress. Abdominal: Soft. Bowel sounds are normal, no distension. There is no tenderness. no masses Breast: no lumps or masses, no nipple discharge or rashes Musculoskeletal: Normal range of motion, no joint effusions. No gross deformities Neurological: he is alert and oriented to person, place, and time. No cranial nerve deficit. Coordination, balance, strength, speech and gait are normal.  Skin: Skin is warm and dry. No rash noted. No erythema.  Psychiatric: Patient has a normal mood and affect. behavior is normal. Judgment and thought content normal.   Recent Results (from the past 2160 hour(s))  CBC with Differential/Platelet     Status: Abnormal   Collection Time: 01/12/23  8:43 AM  Result Value Ref Range   WBC 3.2 (L) 3.8 - 10.8 Thousand/uL   RBC 4.96 3.80 - 5.10 Million/uL   Hemoglobin 15.1 11.7 - 15.5 g/dL   HCT 32.4 40.1 - 02.7 %   MCV 90.7 80.0 - 100.0 fL   MCH 30.4 27.0 - 33.0 pg   MCHC 33.6 32.0 - 36.0 g/dL   RDW 25.3 66.4 - 40.3 %   Platelets 87 (L) 140 - 400 Thousand/uL    Comment: Platelet clumps noted on smear-count appears adequate.   MPV 10.2 7.5 - 12.5 fL   Neutro Abs 1,882 1,500 - 7,800 cells/uL   Lymphs Abs 794 (L) 850 - 3,900 cells/uL   Absolute Monocytes 307 200 - 950 cells/uL  Eosinophils Absolute 189 15 - 500 cells/uL   Basophils Absolute 29 0 - 200 cells/uL   Neutrophils Relative % 58.8 %   Total Lymphocyte 24.8 %   Monocytes Relative 9.6 %   Eosinophils Relative 5.9 %   Basophils Relative 0.9 %    Smear Review      Comment: Review of peripheral smear confirms automated results.   COMPLETE METABOLIC PANEL WITH GFR     Status: Abnormal   Collection Time: 01/12/23  8:43 AM  Result Value Ref Range   Glucose, Bld 102 (H) 65 - 99 mg/dL    Comment: .            Fasting reference interval . For someone without known diabetes, a glucose value between 100 and 125 mg/dL is consistent with prediabetes and should be confirmed with a follow-up test. .    BUN 15 7 - 25 mg/dL   Creat 9.52 8.41 - 3.24 mg/dL   eGFR 99 > OR = 60 MW/NUU/7.25D6   BUN/Creatinine Ratio SEE NOTE: 6 - 22 (calc)    Comment:    Not Reported: BUN and Creatinine are within    reference range. .    Sodium 142 135 - 146 mmol/L   Potassium 4.8 3.5 - 5.3 mmol/L   Chloride 104 98 - 110 mmol/L   CO2 30 20 - 32 mmol/L   Calcium 9.8 8.6 - 10.2 mg/dL   Total Protein 7.2 6.1 - 8.1 g/dL   Albumin 4.5 3.6 - 5.1 g/dL   Globulin 2.7 1.9 - 3.7 g/dL (calc)   AG Ratio 1.7 1.0 - 2.5 (calc)   Total Bilirubin 0.6 0.2 - 1.2 mg/dL   Alkaline phosphatase (APISO) 97 31 - 125 U/L   AST 22 10 - 35 U/L   ALT 28 6 - 29 U/L  Lipid panel     Status: None   Collection Time: 01/12/23  8:43 AM  Result Value Ref Range   Cholesterol 163 <200 mg/dL   HDL 55 > OR = 50 mg/dL   Triglycerides 644 <034 mg/dL   LDL Cholesterol (Calc) 84 mg/dL (calc)    Comment: Reference range: <100 . Desirable range <100 mg/dL for primary prevention;   <70 mg/dL for patients with CHD or diabetic patients  with > or = 2 CHD risk factors. Marland Kitchen LDL-C is now calculated using the Martin-Hopkins  calculation, which is a validated novel method providing  better accuracy than the Friedewald equation in the  estimation of LDL-C.  Horald Pollen et al. Lenox Ahr. 7425;956(38): 2061-2068  (http://education.[facility].com/faq/FAQ164)    Total CHOL/HDL Ratio 3.0 <5.0 (calc)   Non-HDL Cholesterol (Calc) 108 <130 mg/dL (calc)    Comment: For patients with diabetes plus 1  major ASCVD risk  factor, treating to a non-HDL-C goal of <100 mg/dL  (LDL-C of <75 mg/dL) is considered a therapeutic  option.   Hemoglobin A1c     Status: None   Collection Time: 01/12/23  8:43 AM  Result Value Ref Range   Hgb A1c MFr Bld 5.5 <5.7 % of total Hgb    Comment: For the purpose of screening for the presence of diabetes: . <5.7%       Consistent with the absence of diabetes 5.7-6.4%    Consistent with increased risk for diabetes             (prediabetes) > or =6.5%  Consistent with diabetes . This assay result is consistent with a decreased risk of diabetes. . Currently,  no consensus exists regarding use of hemoglobin A1c for diagnosis of diabetes in children. . According to American Diabetes Association (ADA) guidelines, hemoglobin A1c <7.0% represents optimal control in non-pregnant diabetic patients. Different metrics may apply to specific patient populations.  Standards of Medical Care in Diabetes(ADA). .    Mean Plasma Glucose 111 mg/dL   eAG (mmol/L) 6.2 mmol/L    Comment: . This test was performed on the Roche cobas c503 platform. Effective 04/20/22, a change in test platforms from the Abbott Architect to the Roche cobas c503 may have shifted HbA1c results compared to historical results. Based on laboratory validation testing conducted at [facility], the Roche platform relative to the Abbott platform had an average increase in HbA1c value of < or = 0.3%. This difference is within accepted  variability established by the [facility]. Note that not all individuals will have had a shift in their results and direct comparisons between historical and current results for testing conducted on different platforms is not recommended.   Hepatitis C antibody     Status: None   Collection Time: 01/12/23  8:43 AM  Result Value Ref Range   Hepatitis C Ab NON-REACTIVE NON-REACTIVE    Comment: . HCV antibody was non-reactive.  There is no laboratory  evidence of HCV infection. . In most cases, no further action is required. However, if recent HCV exposure is suspected, a test for HCV RNA (test code 46962) is suggested. . For additional information please refer to http://education.[facility].com/faq/FAQ22v1 (This link is being provided for informational/ educational purposes only.) .   HIV Antibody (routine testing w rflx)     Status: None   Collection Time: 01/12/23  8:43 AM  Result Value Ref Range   HIV 1&2 Ab, 4th Generation NON-REACTIVE NON-REACTIVE    Comment: HIV-1 antigen and HIV-1/HIV-2 antibodies were not detected. There is no laboratory evidence of HIV infection. Marland Kitchen PLEASE NOTE: This information has been disclosed to you from records whose confidentiality may be protected by state law.  If your state requires such protection, then the state law prohibits you from making any further disclosure of the information without the specific written consent of the person to whom it pertains, or as otherwise permitted by law. A general authorization for the release of medical or other information is NOT sufficient for this purpose. . For additional information please refer to http://education.[facility].com/faq/FAQ106 (This link is being provided for informational/ educational purposes only.) . Marland Kitchen The performance of this assay has not been clinically validated in patients less than 89 years old. .   Rheumatoid Factor     Status: None   Collection Time: 01/12/23  8:43 AM  Result Value Ref Range   Rheumatoid fact SerPl-aCnc <10 <14 IU/mL  Cologuard     Status: None   Collection Time: 02/09/23  6:15 AM  Result Value Ref Range   COLOGUARD Negative Negative    Comment:  NEGATIVE TEST RESULT. A negative Cologuard result indicates a low likelihood that a colorectal cancer (CRC) or advanced adenoma (adenomatous polyps with more advanced pre-malignant features)  is present. The chance that a  person with a negative Cologuard test has a colorectal cancer is less than 1 in 1500 (negative predictive value >99.9%) or has an  advanced adenoma is less than  5.3% (negative predictive value 94.7%). These data are based on a prospective cross-sectional study of 10,000 individuals at average risk for colorectal cancer who were screened with both Cologuard and colonoscopy. (Imperiale T. et al,  N Engl J Med 2014;370(14):1286-1297) The normal value (reference range) for this assay is negative.  COLOGUARD RE-SCREENING RECOMMENDATION: Periodic colorectal cancer screening is an important part of preventive healthcare for asymptomatic individuals at average risk for colorectal cancer.  Following a negative Cologuard result, the American Cancer Society and U.S.  Multi-Society Task Force screening guidelines recommend a Cologuard re-screening interval of 3 years.  References: American Cancer Society Guideline for Colorectal Cancer Screening: https://www.cancer.org/cancer/colon-rectal-cancer/detection-diagnosis-staging/acs-recommendations.html.; Rex DK, Boland CR, Dominitz JK, Colorectal Cancer Screening: Recommendations for Physicians and Patients from the U.S. Multi-Society Task Force on Colorectal Cancer Screening , Am J Gastroenterology 2017; 112:1016-1030.  TEST DESCRIPTION: Composite algorithmic analysis of stool DNA-biomarkers with hemoglobin immunoassay.   Quantitative values of individual biomarkers are not reportable and are not associated with individual biomarker result reference ranges. Cologuard is intended for colorectal cancer screening of adults of either sex, 45 years or older, who are at average-risk for colorectal cancer (CRC). Cologuard has been approved for use by the U.S. FDA. The performance of Cologuard was  established in a cross sectional study of average-risk adults aged 84-84. Cologuard performance in patients ages 108 to 63 years was estimated by sub-group analysis of near-age groups.  Colonoscopies performed for a positive result may find as the most clinically significant lesion: colorectal cancer [4.0%], advanced adenoma (including sessile serrated polyps greater than or equal to 1cm diameter) [20%] or non- advanced adenoma [31%]; or no colorectal neoplasia [45%]. These estimates are derived from a prospective cross-sectional screening study of 10,000 individuals at average risk for colorectal cancer who were screened with both Cologuard and colonoscopy. (Imperiale T. et al, Macy Mis J Med 2014;370(14):1286-1297.) Cologuard may produce a false negative or false positive result (no colorectal cancer or precancerous polyp present at colonoscopy follow up). A negative Cologuard test result does not guarantee the absence of CRC or advanced adenoma (pre-cancer). The current Cologuard  screening interval is every 3 years. Science writer and [place] Therapist, music). Cologuard performance data in a 10,000 patient pivotal study using colonoscopy as the reference method can be accessed at the following location: www.exactlabs.com/results. Additional description of the Cologuard test process, warnings and precautions can be found at www.cologuard.com.      Fall Risk:    02/18/2023    7:41 AM 01/12/2023    8:14 AM 05/08/2020    3:01 PM 01/23/2020    2:26 PM  Fall Risk   Falls in the past year? 0 0 0 0  Number falls in past yr: 0 0    Injury with Fall? 0 0    Risk for fall due to :   No Fall Risks No Fall Risks  Follow up   Falls evaluation completed Falls evaluation completed     Functional Status Survey: Is the patient deaf or have difficulty hearing?: No Does the patient have difficulty seeing, even when wearing glasses/contacts?: No Does the patient have difficulty concentrating, remembering, or making decisions?: No Does the patient have difficulty walking or climbing stairs?: No Does the patient have difficulty dressing or bathing?: No Does the patient have  difficulty doing errands alone such as visiting a doctor's office or shopping?: No   Assessment & Plan  There are no diagnoses linked to this encounter.  -USPSTF grade A and B recommendations reviewed with patient; age-appropriate recommendations, preventive care, screening tests, etc discussed and encouraged; healthy living encouraged; see AVS for patient education given to patient -Discussed importance of 150 minutes of physical activity weekly, eat two  servings of fish weekly, eat one serving of tree nuts ( cashews, pistachios, pecans, almonds.Marland Kitchen) every other day, eat 6 servings of fruit/vegetables daily and drink plenty of water and avoid sweet beverages.   -Reviewed Health Maintenance: Yes.

## 2023-02-18 ENCOUNTER — Encounter: Payer: Self-pay | Admitting: Nurse Practitioner

## 2023-02-18 ENCOUNTER — Ambulatory Visit (INDEPENDENT_AMBULATORY_CARE_PROVIDER_SITE_OTHER): Payer: 59 | Admitting: Nurse Practitioner

## 2023-02-18 ENCOUNTER — Other Ambulatory Visit: Payer: Self-pay

## 2023-02-18 VITALS — BP 122/84 | HR 98 | Temp 98.1°F | Resp 16 | Ht 63.0 in | Wt 186.2 lb

## 2023-02-18 DIAGNOSIS — Z0000 Encounter for general adult medical examination without abnormal findings: Secondary | ICD-10-CM

## 2023-02-18 DIAGNOSIS — Z1231 Encounter for screening mammogram for malignant neoplasm of breast: Secondary | ICD-10-CM | POA: Diagnosis not present

## 2023-02-23 ENCOUNTER — Ambulatory Visit: Payer: 59 | Admitting: Podiatry

## 2023-02-23 DIAGNOSIS — B070 Plantar wart: Secondary | ICD-10-CM

## 2023-02-23 NOTE — Progress Notes (Signed)
   Chief Complaint  Patient presents with   Plantar Warts    Right hallux plantar wart  Pt stated that she can walk on it now     Subjective: 47 y.o. female presenting today for follow-up evaluation of a plantar verruca to the plantar aspect of the right great toe.  Patient states that the Cantharone helped significantly.  She no longer has any pain associated to the lesion.  She believes it is completely resolved  Past Medical History:  Diagnosis Date   Abnormal uterine bleeding    Allergy    Hypertension    Infertility, female    Kidney stones     Objective: Physical Exam General: The patient is alert and oriented x3 in no acute distress.   Dermatology: The hyperkeratotic plantar verruca to the plantar aspect of the right great toe has resolved.  There is no clinical evidence of verruca tissue remaining   Vascular: Palpable pedal pulses bilaterally. No edema or erythema noted. Capillary refill within normal limits.   Neurological: Grossly intact via light touch   Musculoskeletal Exam: No pedal deformity.   Assessment: #1 plantar wart right foot    Plan of Care:  -Patient was evaluated. -Light debridement of some of the hyperkeratotic callus was performed today.  Salicylic acid applied. 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 Works at [employer]     Felecia Shelling, DPM [facility]  Dr. Felecia Shelling, DPM    [address]                                        [place], [place] [address]                Office [phone]  Fax [phone]

## 2023-04-13 ENCOUNTER — Other Ambulatory Visit: Payer: Self-pay

## 2023-04-13 ENCOUNTER — Ambulatory Visit: Payer: Self-pay

## 2023-04-13 ENCOUNTER — Telehealth: Payer: 59 | Admitting: Physician Assistant

## 2023-04-13 DIAGNOSIS — R197 Diarrhea, unspecified: Secondary | ICD-10-CM | POA: Diagnosis not present

## 2023-04-13 MED ORDER — DICYCLOMINE HCL 10 MG PO CAPS
10.0000 mg | ORAL_CAPSULE | Freq: Three times a day (TID) | ORAL | 0 refills | Status: DC
  Filled 2023-04-13: qty 40, 10d supply, fill #0

## 2023-04-13 MED ORDER — ONDANSETRON 4 MG PO TBDP
4.0000 mg | ORAL_TABLET | Freq: Three times a day (TID) | ORAL | 0 refills | Status: DC | PRN
Start: 2023-04-13 — End: 2023-07-19
  Filled 2023-04-13: qty 20, 7d supply, fill #0

## 2023-04-13 MED ORDER — LOPERAMIDE HCL 2 MG PO TABS
2.0000 mg | ORAL_TABLET | Freq: Four times a day (QID) | ORAL | 0 refills | Status: DC | PRN
Start: 2023-04-13 — End: 2023-07-19
  Filled 2023-04-13: qty 24, 6d supply, fill #0

## 2023-04-13 NOTE — Telephone Encounter (Signed)
Called patient and she did a Water engineer Visit.

## 2023-04-13 NOTE — Progress Notes (Signed)
Virtual Visit Consent   Symantha King, you are scheduled for a virtual visit with a [facility] provider today. Just as with appointments in the office, your consent must be obtained to participate. Your consent will be active for this visit and any virtual visit you may have with one of our providers in the next 365 days. If you have a MyChart account, a copy of this consent can be sent to you electronically.  As this is a virtual visit, video technology does not allow for your provider to perform a traditional examination. This may limit your provider's ability to fully assess your condition. If your provider identifies any concerns that need to be evaluated in person or the need to arrange testing (such as labs, EKG, etc.), we will make arrangements to do so. Although advances in technology are sophisticated, we cannot ensure that it will always work on either your end or our end. If the connection with a video visit is poor, the visit may have to be switched to a telephone visit. With either a video or telephone visit, we are not always able to ensure that we have a secure connection.  By engaging in this virtual visit, you consent to the provision of healthcare and authorize for your insurance to be billed (if applicable) for the services provided during this visit. Depending on your insurance coverage, you may receive a charge related to this service.  I need to obtain your verbal consent now. Are you willing to proceed with your visit today? Claire King has provided verbal consent on 04/13/2023 for a virtual visit (video or telephone). Claire Loveless, PA-C  Date: 04/13/2023 1:57 PM  Virtual Visit via Video Note   I, Claire King, connected with  Claire King  ([id number], [birth year]) on 04/13/23 at  1:45 PM EDT by a video-enabled telemedicine application and verified that I am speaking with the correct person using two identifiers.  Location: Patient: Virtual Visit  Location Patient: Home Provider: Virtual Visit Location Provider: Home Office   I discussed the limitations of evaluation and management by telemedicine and the availability of in person appointments. The patient expressed understanding and agreed to proceed.    History of Present Illness: Claire King is a 47 y.o. who identifies as a female who was assigned female at birth, and is being seen today for diarrhea and cramping.  HPI: Diarrhea  This is a new problem. The current episode started in the past 7 days (Saturday, 04/10/23). The problem occurs more than 10 times per day (feels hourly). The problem has been gradually worsening. The stool consistency is described as Watery. The patient states that diarrhea awakens her from sleep. Associated symptoms include abdominal pain, bloating, chills, a fever (low 100s), headaches, myalgias, sweats and vomiting (once on saturday). Pertinent negatives include no arthralgias, coughing, URI or weight loss. Associated symptoms comments: Feels hungry, but when she eats immediately feels appetite decrease. Nothing aggravates the symptoms. She has tried nothing for the symptoms. The treatment provided no relief. There is no history of bowel resection, inflammatory bowel disease, irritable bowel syndrome, malabsorption or a recent abdominal surgery.  Have had coworkers out with potential norovirus infections.   Problems:  Patient Active Problem List   Diagnosis Date Noted   Primary hypertension 01/12/2023   Class 1 obesity due to excess calories with serious comorbidity and body mass index (BMI) of 32.0 to 32.9 in adult 01/12/2023    Allergies: No Known Allergies Medications:  Current Outpatient Medications:  dicyclomine (BENTYL) 10 MG capsule, Take 1 capsule (10 mg total) by mouth 4 (four) times daily -  before meals and at bedtime., Disp: 40 capsule, Rfl: 0   loperamide (IMODIUM A-D) 2 MG tablet, Take 1 tablet (2 mg total) by mouth 4 (four) times  daily as needed for diarrhea or loose stools., Disp: 30 tablet, Rfl: 0   ondansetron (ZOFRAN-ODT) 4 MG disintegrating tablet, Take 1 tablet (4 mg total) by mouth every 8 (eight) hours as needed., Disp: 20 tablet, Rfl: 0   fluticasone (FLONASE) 50 MCG/ACT nasal spray, Place 2 sprays into both nostrils daily as needed for allergies or rhinitis., Disp: 15.8 mL, Rfl: 2   lisinopril (ZESTRIL) 5 MG tablet, Take 1 tablet (5 mg total) by mouth daily., Disp: 90 tablet, Rfl: 1   meloxicam (MOBIC) 7.5 MG tablet, Take 1 tablet (7.5 mg total) by mouth daily., Disp: 90 tablet, Rfl: 1   Turmeric (QC TUMERIC COMPLEX) 500 MG CAPS, , Disp: , Rfl:   Observations/Objective: Patient is well-developed, well-nourished in no acute distress.  Resting comfortably at home.  Head is normocephalic, atraumatic.  No labored breathing.  Speech is clear and coherent with logical content.  Patient is alert and oriented at baseline.    Assessment and Plan: 1. Diarrhea, unspecified type - loperamide (IMODIUM A-D) 2 MG tablet; Take 1 tablet (2 mg total) by mouth 4 (four) times daily as needed for diarrhea or loose stools.  Dispense: 30 tablet; Refill: 0 - dicyclomine (BENTYL) 10 MG capsule; Take 1 capsule (10 mg total) by mouth 4 (four) times daily -  before meals and at bedtime.  Dispense: 40 capsule; Refill: 0 - ondansetron (ZOFRAN-ODT) 4 MG disintegrating tablet; Take 1 tablet (4 mg total) by mouth every 8 (eight) hours as needed.  Dispense: 20 tablet; Refill: 0  - Suspect viral enteritis, possible Norovirus - Bentyl for cramping  - Imodium for diarrhea - Zofran for nausea - Push fluids, electrolyte beverages - Liquid diet, then increase to soft/bland (BRAT) diet over next day, then increase diet as tolerated - Seek in person evaluation if not improving or symptoms worsen   Follow Up Instructions: I discussed the assessment and treatment plan with the patient. The patient was provided an opportunity to ask questions  and all were answered. The patient agreed with the plan and demonstrated an understanding of the instructions.  A copy of instructions were sent to the patient via MyChart unless otherwise noted below.    The patient was advised to call back or seek an in-person evaluation if the symptoms worsen or if the condition fails to improve as anticipated.   Claire Loveless, PA-C

## 2023-04-13 NOTE — Telephone Encounter (Signed)
  Chief Complaint: severe diarrhea Symptoms: very tired from lack of sleep, water consistency, small amount of blood on TP, cramping before every BM, low grade fever, 6 pound weight loss Frequency: Saturday  Pertinent Negatives: Patient denies abx use, vomiting Disposition: [] ED /[] Urgent Care (no appt availability in office) / [] Appointment(In office/virtual)/ [x]  [facility]/ [] Home Care/ [] Refused Recommended Disposition /[] [facility]/ []  Follow-up with PCP Additional Notes: no appt here or with Erin Mecum at [facility]. Reason for Disposition  [1] SEVERE diarrhea (e.g., 7 or more times / day more than normal) AND [2] present > 24 hours (1 day)  Answer Assessment - Initial Assessment Questions 1. DIARRHEA SEVERITY: "How bad is the diarrhea?" "How many more stools have you had in the past 24 hours than normal?"    - NO DIARRHEA (SCALE 0)   - MILD (SCALE 1-3): Few loose or mushy BMs; increase of 1-3 stools over normal daily number of stools; mild increase in ostomy output.   -  MODERATE (SCALE 4-7): Increase of 4-6 stools daily over normal; moderate increase in ostomy output.   -  SEVERE (SCALE 8-10; OR "WORST POSSIBLE"): Increase of 7 or more stools daily over normal; moderate increase in ostomy output; incontinence.     severe 2. ONSET: "When did the diarrhea begin?"      Sat  3. BM CONSISTENCY: "How loose or watery is the diarrhea?"      Watery  4. VOMITING: "Are you also vomiting?" If Yes, ask: "How many times in the past 24 hours?"      no 5. ABDOMEN PAIN: "Are you having any abdomen pain?" If Yes, ask: "What does it feel like?" (e.g., crampy, dull, intermittent, constant)      5 crampy 6. ABDOMEN PAIN SEVERITY: If present, ask: "How bad is the pain?"  (e.g., Scale 1-10; mild, moderate, or severe)   - MILD (1-3): doesn't interfere with normal activities, abdomen soft and not tender to touch    - MODERATE (4-7): interferes with normal activities or awakens from sleep,  abdomen tender to touch    - SEVERE (8-10): excruciating pain, doubled over, unable to do any normal activities       5/10 7. ORAL INTAKE: If vomiting, "Have you been able to drink liquids?" "How much liquids have you had in the past 24 hours?"     N/a 8. HYDRATION: "Any signs of dehydration?" (e.g., dry mouth [not just dry lips], too weak to stand, dizziness, new weight loss) "When did you last urinate?"     2 hours/ lost 6 pounds  9. 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 ANTIBIOTIC USE: "Are you taking antibiotics now or have you taken antibiotics in the past 2 months?"       no 11. OTHER SYMPTOMS: "Do you have any other symptoms?" (e.g., fever, blood in stool)       Fever, has an appetitie cause cramping  12. PREGNANCY: "Is there any chance you are pregnant?" "When was your last menstrual period?"       N/a  Protocols used: [facility]

## 2023-04-13 NOTE — Patient Instructions (Signed)
Shade Flood, thank you for joining [name], PA-C for today's virtual visit.  While this provider is not your primary care provider (PCP), if your PCP is located in our provider database this encounter information will be shared with them immediately following your visit.   A [facility] MyChart account gives you access to today's visit and all your visits, tests, and labs performed at [facility] " click here if you don't have a [place] MyChart account or go to [email or web address]  Consent: (Patient) Claire King provided verbal consent for this virtual visit at the beginning of the encounter.  Current Medications:  Current Outpatient Medications:    dicyclomine (BENTYL) 10 MG capsule, Take 1 capsule (10 mg total) by mouth 4 (four) times daily -  before meals and at bedtime., Disp: 40 capsule, Rfl: 0   loperamide (IMODIUM A-D) 2 MG tablet, Take 1 tablet (2 mg total) by mouth 4 (four) times daily as needed for diarrhea or loose stools., Disp: 30 tablet, Rfl: 0   ondansetron (ZOFRAN-ODT) 4 MG disintegrating tablet, Take 1 tablet (4 mg total) by mouth every 8 (eight) hours as needed., Disp: 20 tablet, Rfl: 0   fluticasone (FLONASE) 50 MCG/ACT nasal spray, Place 2 sprays into both nostrils daily as needed for allergies or rhinitis., Disp: 15.8 mL, Rfl: 2   lisinopril (ZESTRIL) 5 MG tablet, Take 1 tablet (5 mg total) by mouth daily., Disp: 90 tablet, Rfl: 1   meloxicam (MOBIC) 7.5 MG tablet, Take 1 tablet (7.5 mg total) by mouth daily., Disp: 90 tablet, Rfl: 1   Turmeric (QC TUMERIC COMPLEX) 500 MG CAPS, , Disp: , Rfl:    Medications ordered in this encounter:  Meds ordered this encounter  Medications   loperamide (IMODIUM A-D) 2 MG tablet    Sig: Take 1 tablet (2 mg total) by mouth 4 (four) times daily as needed for diarrhea or loose stools.    Dispense:  30 tablet    Refill:  0    Order Specific Question:   Supervising Provider    Answer:    Merrilee Jansky [[id number]]   dicyclomine (BENTYL) 10 MG capsule    Sig: Take 1 capsule (10 mg total) by mouth 4 (four) times daily -  before meals and at bedtime.    Dispense:  40 capsule    Refill:  0    Order Specific Question:   Supervising Provider    Answer:   LAMPTEY, PHILIP O [[id number]]   ondansetron (ZOFRAN-ODT) 4 MG disintegrating tablet    Sig: Take 1 tablet (4 mg total) by mouth every 8 (eight) hours as needed.    Dispense:  20 tablet    Refill:  0    Order Specific Question:   Supervising Provider    Answer:   Merrilee Jansky [id number]     *If you need refills on other medications prior to your next appointment, please contact your pharmacy*  Follow-Up: Call back or seek an in-person evaluation if the symptoms worsen or if the condition fails to improve as anticipated.  [facility] [phone]  Other Instructions  Norovirus Infection Norovirus infection causes inflammation in the stomach and intestines (gastroenteritis) and food poisoning. It is caused by exposure to a virus from a group of similar viruses called noroviruses. Norovirus spreads very easily from person to person (is very contagious). It often occurs in places where people are in close contact, such as schools, nursing homes, restaurants, and cruise ships.  You can get it from food, water, surfaces, or other people who have the virus. Norovirus is also found in the stool (feces) or vomit of infected people. You can spread the infection as soon as you feel sick, and you may continue to be contagious after you recover. What are the causes? This condition is caused by contact with norovirus. You can catch norovirus if you: Eat or drink something that is contaminated with norovirus. Touch surfaces or objects that are contaminated with norovirus and then put your hand in or by your mouth or nose. Have direct contact with an infected person who may or may not still have symptoms. Share food,  drink, or utensils with someone who is contagious with norovirus. What are the signs or symptoms? Symptoms usually begin within 12 hours to 2 days after you become infected. Most norovirus symptoms affect the digestive system.Symptoms may include: Nausea, vomiting, and diarrhea. Stomach cramps. Fever. Chills. Headache. Muscle aches and tiredness. How is this diagnosed? This condition may be diagnosed based on: Your symptoms. A physical exam. A stool test. How is this treated? There is no specific treatment for norovirus. Most people get better without treatment in about 2 days. Young children, the elderly, and people who are already sick may take up to 6 days to recover. Follow these instructions at home:  Eating and drinking  Drink plenty of water to replace fluids that are lost through diarrhea and vomiting. This prevents dehydration. Drink enough fluid to keep your urine pale yellow. Drink clear fluids in small amounts as you are able. Clear fluids include water, ice chips, fruit juice with water added (diluted fruit juice), and low-calorie sports drinks. Avoid fluids that contain a lot of sugar or caffeine, such as energy drinks, sports drinks, and soda. Avoid alcohol. If instructed by your health care provider, drink an oral rehydration solution (ORS). This is a drink that is sold at pharmacies and retail stores. An ORS contains minerals (electrolytes) that you can lose through diarrhea and vomiting. Eat bland, easy-to-digest foods in small amounts as you are able. These foods include rice, lean meats, toast, and crackers. Avoid spicy or fatty foods. General instructions Rest at home while you recover. Do not prepare food for others while you are infected. Wait at least 3 days after you recover from the illness to do this. Take over-the-counter and prescription medicines only as told by your health care provider. Wash your hands frequently with soap and water for at least 20  seconds. Alcohol-based hand sanitizer can be used in addition to soap and water, but sanitizer should not be the only cleansing method because it is not effective at removing norovirus from your hands or surfaces. Make sure that each person in your household washes his or her hands well and often. Keep all follow-up visits. This is important. How is this prevented? To help prevent the spread of norovirus: Stay at home if you are feeling sick. This will reduce the risk of spreading the virus to others. Wash your hands often with soap and water for at least 20 seconds, especially after using the toilet, helping a child use the toilet, or changing a child's diaper. Wash fruits and vegetables thoroughly before peeling, preparing, or serving them. Throw out any food that a sick person may have touched. Disinfect contaminated surfaces immediately after someone in the household has been sick. Disinfect frequently used surfaces, such as counters, doorknobs, and faucets. Use a bleach-based household cleaner. Immediately remove and wash soiled  clothes or sheets. Contact a health care provider if: You have vomiting, diarrhea, or stomach pain that gets worse. You have symptoms that do not go away after 3-6 days. You have a fever. You cannot drink without vomiting. You feel light-headed or dizzy. Your symptoms get worse. Get help right away if: You develop symptoms of dehydration that do not improve with fluid replacement, such as: Excessive sleepiness. Lack of tears. Very little urine production. Dry mouth. Muscle cramps. Weak pulse. Confusion. Summary Norovirus infection is common and often occurs in places where people are in close contact, such as schools, nursing homes, restaurants, and cruise ships. To help prevent the spread of this infection, wash hands with soap and water for at least 20 seconds before handling food or after having contact with stool or body fluids. There is no specific  treatment for norovirus, but most people get better without treatment in about 2 days. People who are healthy when infected often recover sooner than those who are elderly, young, or already sick. Replace lost fluids by drinking plenty of water, or by drinking oral rehydration solution (ORS), which contains important minerals called electrolytes. This prevents dehydration. This information is not intended to replace advice given to you by your health care provider. Make sure you discuss any questions you have with your health care provider. Document Revised: 02/05/2021 Document Reviewed: 02/05/2021 Elsevier Patient Education  2024 Elsevier Inc.    If you have been instructed to have an in-person evaluation today at a local Urgent Care facility, please use the link below. It will take you to a list of all of our available [facility], including address, phone number and hours of operation. Please do not delay care.  [facility]  If you or a family member do not have a primary care provider, use the link below to schedule a visit and establish care. When you choose a [facility] primary care physician or advanced practice provider, you gain a long-term partner in health. Find a Primary Care Provider  Learn more about [facility]'s in-office and virtual care options: [facility] - Get Care Now

## 2023-07-12 ENCOUNTER — Emergency Department: Payer: 59

## 2023-07-12 ENCOUNTER — Emergency Department
Admission: EM | Admit: 2023-07-12 | Discharge: 2023-07-12 | Disposition: A | Discharge status: Against Medical Advice | Payer: 59 | Attending: Physician Assistant | Admitting: Physician Assistant

## 2023-07-12 ENCOUNTER — Other Ambulatory Visit: Payer: Self-pay

## 2023-07-12 ENCOUNTER — Ambulatory Visit: Payer: Self-pay | Admitting: *Deleted

## 2023-07-12 DIAGNOSIS — Z5321 Procedure and treatment not carried out due to patient leaving prior to being seen by health care provider: Secondary | ICD-10-CM | POA: Insufficient documentation

## 2023-07-12 DIAGNOSIS — R109 Unspecified abdominal pain: Secondary | ICD-10-CM | POA: Insufficient documentation

## 2023-07-12 DIAGNOSIS — R110 Nausea: Secondary | ICD-10-CM | POA: Diagnosis not present

## 2023-07-12 DIAGNOSIS — K449 Diaphragmatic hernia without obstruction or gangrene: Secondary | ICD-10-CM | POA: Diagnosis not present

## 2023-07-12 DIAGNOSIS — K5730 Diverticulosis of large intestine without perforation or abscess without bleeding: Secondary | ICD-10-CM | POA: Diagnosis not present

## 2023-07-12 DIAGNOSIS — K769 Liver disease, unspecified: Secondary | ICD-10-CM | POA: Diagnosis not present

## 2023-07-12 DIAGNOSIS — N132 Hydronephrosis with renal and ureteral calculous obstruction: Secondary | ICD-10-CM | POA: Diagnosis not present

## 2023-07-12 LAB — CBC WITH DIFFERENTIAL/PLATELET
Abs Immature Granulocytes: 0.02 10*3/uL (ref 0.00–0.07)
Basophils Absolute: 0 10*3/uL (ref 0.0–0.1)
Basophils Relative: 0 %
Eosinophils Absolute: 0.1 10*3/uL (ref 0.0–0.5)
Eosinophils Relative: 1 %
HCT: 46.4 % — ABNORMAL HIGH (ref 36.0–46.0)
Hemoglobin: 15.4 g/dL — ABNORMAL HIGH (ref 12.0–15.0)
Immature Granulocytes: 0 %
Lymphocytes Relative: 12 %
Lymphs Abs: 1 10*3/uL (ref 0.7–4.0)
MCH: 30.2 pg (ref 26.0–34.0)
MCHC: 33.2 g/dL (ref 30.0–36.0)
MCV: 91 fL (ref 80.0–100.0)
Monocytes Absolute: 0.4 10*3/uL (ref 0.1–1.0)
Monocytes Relative: 5 %
Neutro Abs: 6.5 10*3/uL (ref 1.7–7.7)
Neutrophils Relative %: 82 %
Platelets: 152 10*3/uL (ref 150–400)
RBC: 5.1 MIL/uL (ref 3.87–5.11)
RDW: 13.2 % (ref 11.5–15.5)
WBC: 8 10*3/uL (ref 4.0–10.5)
nRBC: 0 % (ref 0.0–0.2)

## 2023-07-12 LAB — COMPREHENSIVE METABOLIC PANEL
ALT: 31 U/L (ref 0–44)
AST: 23 U/L (ref 15–41)
Albumin: 4.4 g/dL (ref 3.5–5.0)
Alkaline Phosphatase: 109 U/L (ref 38–126)
Anion gap: 10 (ref 5–15)
BUN: 23 mg/dL — ABNORMAL HIGH (ref 6–20)
CO2: 25 mmol/L (ref 22–32)
Calcium: 9.4 mg/dL (ref 8.9–10.3)
Chloride: 103 mmol/L (ref 98–111)
Creatinine, Ser: 1.01 mg/dL — ABNORMAL HIGH (ref 0.44–1.00)
GFR, Estimated: >60 mL/min (ref >60)
Glucose, Bld: 105 mg/dL — ABNORMAL HIGH (ref 70–99)
Potassium: 3.9 mmol/L (ref 3.5–5.1)
Sodium: 138 mmol/L (ref 135–145)
Total Bilirubin: 0.7 mg/dL (ref 0.0–1.2)
Total Protein: 7.8 g/dL (ref 6.5–8.1)

## 2023-07-12 LAB — URINALYSIS, COMPLETE (UACMP) WITH MICROSCOPIC
Bilirubin Urine: NEGATIVE
Glucose, UA: NEGATIVE mg/dL
Ketones, ur: NEGATIVE mg/dL
Leukocytes,Ua: NEGATIVE
Nitrite: NEGATIVE
Protein, ur: 30 mg/dL — AB
RBC / HPF: >50 RBC/hpf (ref 0–5)
Specific Gravity, Urine: 1.021 (ref 1.005–1.030)
pH: 5 (ref 5.0–8.0)

## 2023-07-12 LAB — POC URINE PREG, ED: Preg Test, Ur: NEGATIVE

## 2023-07-12 NOTE — Telephone Encounter (Signed)
  Chief Complaint: left flank pain Symptoms: left flank pain radiates to front. Possible kidney stone per patient. Constant pain sweating Frequency: today  Pertinent Negatives: Patient denies chest pain  Disposition: [x] ED /[] Urgent Care (no appt availability in office) / [] Appointment(In office/virtual)/ []  [facility]/ [] Home Care/ [] Refused Recommended Disposition /[] [facility]/ []  Follow-up with PCP Additional Notes:   Recommended ED now or call 911. Patient does not want to go to ED and sit for hours. Requesting PCP order [place] . Please advise.       Reason for Disposition  [1] SEVERE pain (e.g., excruciating, scale 8-10) AND [2] present > 1 hour  Answer Assessment - Initial Assessment Questions 1. LOCATION: "Where does it hurt?" (e.g., left, right)     Left flank  2. ONSET: "When did the pain start?"     Worse today  3. SEVERITY: "How bad is the pain?" (e.g., Scale 1-10; mild, moderate, or severe)   - MILD (1-3): doesn't interfere with normal activities    - MODERATE (4-7): interferes with normal activities or awakens from sleep    - SEVERE (8-10): excruciating pain and patient unable to do normal activities (stays in bed)       severe 4. PATTERN: "Does the pain come and go, or is it constant?"      constant 5. CAUSE: "What do you think is causing the pain?"     Possible kidney stone  6. OTHER SYMPTOMS:  "Do you have any other symptoms?" (e.g., fever, abdomen pain, vomiting, leg weakness, burning with urination, blood in urine)     Left flank pain radiates to front dark urine  7. PREGNANCY:  "Is there any chance you are pregnant?" "When was your last menstrual period?"     na  Protocols used: Flank Pain-A-AH

## 2023-07-12 NOTE — ED Triage Notes (Signed)
Pt c/o acute onset L flank pain at 1300. Pt reports her urine was darker in color last night. She was at work when the colicky type pain started. Pt endorses nausea with pain surges.

## 2023-07-12 NOTE — Telephone Encounter (Signed)
Left detailed vm unable to schedule ultrasound with out being seen, but could possibly get her in tomorrow w/ Raynelle Fanning

## 2023-07-13 ENCOUNTER — Ambulatory Visit (INDEPENDENT_AMBULATORY_CARE_PROVIDER_SITE_OTHER): Payer: 59 | Admitting: Family Medicine

## 2023-07-13 ENCOUNTER — Ambulatory Visit
Admission: RE | Admit: 2023-07-13 | Discharge: 2023-07-13 | Disposition: A | Discharge status: Home / Self Care | Payer: 59 | Source: Ambulatory Visit | Attending: Family Medicine | Admitting: Family Medicine

## 2023-07-13 ENCOUNTER — Telehealth: Payer: Self-pay | Admitting: *Deleted

## 2023-07-13 ENCOUNTER — Other Ambulatory Visit: Payer: Self-pay

## 2023-07-13 ENCOUNTER — Encounter: Payer: Self-pay | Admitting: Family Medicine

## 2023-07-13 VITALS — BP 132/84 | HR 98 | Temp 98.0°F | Resp 18 | Ht 63.0 in | Wt 186.0 lb

## 2023-07-13 DIAGNOSIS — N138 Other obstructive and reflux uropathy: Secondary | ICD-10-CM | POA: Diagnosis not present

## 2023-07-13 DIAGNOSIS — K769 Liver disease, unspecified: Secondary | ICD-10-CM

## 2023-07-13 DIAGNOSIS — K7689 Other specified diseases of liver: Secondary | ICD-10-CM | POA: Diagnosis not present

## 2023-07-13 DIAGNOSIS — N200 Calculus of kidney: Secondary | ICD-10-CM

## 2023-07-13 DIAGNOSIS — N132 Hydronephrosis with renal and ureteral calculous obstruction: Secondary | ICD-10-CM | POA: Diagnosis not present

## 2023-07-13 DIAGNOSIS — K5730 Diverticulosis of large intestine without perforation or abscess without bleeding: Secondary | ICD-10-CM | POA: Diagnosis not present

## 2023-07-13 DIAGNOSIS — N202 Calculus of kidney with calculus of ureter: Secondary | ICD-10-CM | POA: Diagnosis not present

## 2023-07-13 DIAGNOSIS — K449 Diaphragmatic hernia without obstruction or gangrene: Secondary | ICD-10-CM | POA: Diagnosis not present

## 2023-07-13 MED ORDER — TAMSULOSIN HCL 0.4 MG PO CAPS
0.4000 mg | ORAL_CAPSULE | Freq: Every day | ORAL | 0 refills | Status: DC
  Filled 2023-07-13: qty 30, 30d supply, fill #0

## 2023-07-13 NOTE — Progress Notes (Signed)
 Name: Claire King   MRN: [id number]    DOB: 09-09-1975   Date:07/13/2023       Progress Note  Subjective  Chief Complaint  Chief Complaint  Patient presents with   Flank Pain    Left went ER yesterday did not wait did do CT stone- results in abnormal    HPI  Kidney stone with ureter obstruction and hydronephrosis: she left AMA yesterday, she states pain not as intense, it went from severe pain to a aching sensation on left flank .  She had a similar episode 20 years ago and had to have a cystoscopy with stone retrieval same side. She states she left AMA since pain improved while she was waiting to see physician at [facility] No fever or chills. Denies blood in urine today , she is voiding well. We tried sending her to Urologist but no available appointments we will send her for a stat CT renal search to make sure she passed stone   Patient Active Problem List   Diagnosis Date Noted   Primary hypertension 01/12/2023   Class 1 obesity due to excess calories with serious comorbidity and body mass index (BMI) of 32.0 to 32.9 in adult 01/12/2023    Past Surgical History:  Procedure Laterality Date   kidney stone with stent      Family History  Problem Relation Age of Onset   Arthritis Mother    Lung cancer Maternal Grandmother     Social History   Tobacco Use   Smoking status: Former    Current packs/day: 0.00    Average packs/day: 0.5 packs/day for 15.0 years (7.5 ttl pk-yrs)    Types: Cigarettes    Quit date: 05/12/2022    Years since quitting: 1.1   Smokeless tobacco: Never  Substance Use Topics   Alcohol use: Never     Current Outpatient Medications:    dicyclomine  (BENTYL ) 10 MG capsule, Take 1 capsule (10 mg total) by mouth 4 (four) times daily -  before meals and at bedtime., Disp: 40 capsule, Rfl: 0   fluticasone  (FLONASE ) 50 MCG/ACT nasal spray, Place 2 sprays into both nostrils daily as needed for allergies or rhinitis., Disp: 15.8 mL, Rfl: 2   lisinopril   (ZESTRIL ) 5 MG tablet, Take 1 tablet (5 mg total) by mouth daily., Disp: 90 tablet, Rfl: 1   loperamide  (IMODIUM  A-D) 2 MG tablet, Take 1 tablet (2 mg total) by mouth 4 (four) times daily as needed for diarrhea or loose stools., Disp: 30 tablet, Rfl: 0   meloxicam  (MOBIC ) 7.5 MG tablet, Take 1 tablet (7.5 mg total) by mouth daily., Disp: 90 tablet, Rfl: 1   ondansetron  (ZOFRAN -ODT) 4 MG disintegrating tablet, Dissolve 1 tablet (4 mg total) by mouth every 8 (eight) hours as needed., Disp: 20 tablet, Rfl: 0   Turmeric (QC TUMERIC COMPLEX) 500 MG CAPS, , Disp: , Rfl:   No Known Allergies  I personally reviewed active problem list, medication list, allergies with the patient/caregiver today.   ROS  Ten systems reviewed and is negative except as mentioned in HPI    Objective  Vitals:   07/13/23 1102  BP: 132/84  Pulse: 98  Resp: 18  Temp: 98 F (36.7 C)  SpO2: 98%  Weight: 186 lb (84.4 kg)  Height: 5' 3 (1.6 m)    Body mass index is 32.95 kg/m.  Physical Exam  Constitutional: Patient appears well-developed and well-nourished. Obese  No distress.  HEENT: head atraumatic, normocephalic, pupils equal and reactive  to light, neck supple, throat within normal limits Cardiovascular: Normal rate, regular rhythm and normal heart sounds.  No murmur heard. No BLE edema. Pulmonary/Chest: Effort normal and breath sounds normal. No respiratory distress. Abdominal: Soft.  There is no tenderness. Psychiatric: Patient has a normal mood and affect. behavior is normal. Judgment and thought content normal.   Recent Results (from the past 2160 hours)  CBC with Differential     Status: Abnormal   Collection Time: 07/12/23  4:42 PM  Result Value Ref Range   WBC 8.0 4.0 - 10.5 K/uL   RBC 5.10 3.87 - 5.11 MIL/uL   Hemoglobin 15.4 (H) 12.0 - 15.0 g/dL   HCT 53.5 (H) 63.9 - 53.9 %   MCV 91.0 80.0 - 100.0 fL   MCH 30.2 26.0 - 34.0 pg   MCHC 33.2 30.0 - 36.0 g/dL   RDW 86.7 88.4 - 84.4 %    Platelets 152 150 - 400 K/uL   nRBC 0.0 0.0 - 0.2 %   Neutrophils Relative % 82 %   Neutro Abs 6.5 1.7 - 7.7 K/uL   Lymphocytes Relative 12 %   Lymphs Abs 1.0 0.7 - 4.0 K/uL   Monocytes Relative 5 %   Monocytes Absolute 0.4 0.1 - 1.0 K/uL   Eosinophils Relative 1 %   Eosinophils Absolute 0.1 0.0 - 0.5 K/uL   Basophils Relative 0 %   Basophils Absolute 0.0 0.0 - 0.1 K/uL   Immature Granulocytes 0 %   Abs Immature Granulocytes 0.02 0.00 - 0.07 K/uL    Comment: Performed at [facility], [address] [place], [place] [address]  Comprehensive metabolic panel     Status: Abnormal   Collection Time: 07/12/23  4:42 PM  Result Value Ref Range   Sodium 138 135 - 145 mmol/L   Potassium 3.9 3.5 - 5.1 mmol/L   Chloride 103 98 - 111 mmol/L   CO2 25 22 - 32 mmol/L   Glucose, Bld 105 (H) 70 - 99 mg/dL    Comment: Glucose reference range applies only to samples taken after fasting for at least 8 hours.   BUN 23 (H) 6 - 20 mg/dL   Creatinine, Ser 8.98 (H) 0.44 - 1.00 mg/dL   Calcium 9.4 8.9 - 89.6 mg/dL   Total Protein 7.8 6.5 - 8.1 g/dL   Albumin 4.4 3.5 - 5.0 g/dL   AST 23 15 - 41 U/L   ALT 31 0 - 44 U/L   Alkaline Phosphatase 109 38 - 126 U/L   Total Bilirubin 0.7 0.0 - 1.2 mg/dL   GFR, Estimated >39 >39 mL/min    Comment: (NOTE) Calculated using the CKD-EPI Creatinine Equation (2021)    Anion gap 10 5 - 15    Comment: Performed at [facility], [address] [place], [place] [address]  Urinalysis, Complete w Microscopic -Urine, Clean Catch     Status: Abnormal   Collection Time: 07/12/23  4:42 PM  Result Value Ref Range   Color, Urine YELLOW (A) YELLOW   APPearance CLOUDY (A) CLEAR   Specific Gravity, Urine 1.021 1.005 - 1.030   pH 5.0 5.0 - 8.0   Glucose, UA NEGATIVE NEGATIVE mg/dL   Hgb urine dipstick LARGE (A) NEGATIVE   Bilirubin Urine NEGATIVE NEGATIVE   Ketones, ur NEGATIVE NEGATIVE mg/dL   Protein, ur 30 (A) NEGATIVE mg/dL   Nitrite NEGATIVE  NEGATIVE   Leukocytes,Ua NEGATIVE NEGATIVE   RBC / HPF >50 0 - 5 RBC/hpf  WBC, UA 0-5 0 - 5 WBC/hpf   Bacteria, UA MANY (A) NONE SEEN   Squamous Epithelial / HPF 11-20 0 - 5 /HPF   Mucus PRESENT     Comment: Performed at [facility], [address] [place], [place] [address]  POC Urine Pregnancy, ED     Status: None   Collection Time: 07/12/23  4:51 PM  Result Value Ref Range   Preg Test, Ur Negative Negative    Diabetic Foot Exam:     PHQ2/9:    07/13/2023   11:03 AM 02/18/2023    7:41 AM 01/12/2023    8:14 AM 05/08/2020    3:01 PM 01/23/2020    2:08 PM  Depression screen PHQ 2/9  Decreased Interest 0 0 0 0 0  Down, Depressed, Hopeless 0 0 0 0 0  PHQ - 2 Score 0 0 0 0 0  Altered sleeping 0   0 0  Tired, decreased energy 0   0 0  Change in appetite 0   0 0  Feeling bad or failure about yourself  0   0 0  Trouble concentrating 0   0 0  Moving slowly or fidgety/restless 0   0 0  Suicidal thoughts 0   0 0  PHQ-9 Score 0   0 0  Difficult doing work/chores Not difficult at all        phq 9 is negative   Fall Risk:    Assessment & Plan  1. Kidney stone on left side (Primary)  - CT RENAL STONE STUDY; Future  Stay at hospital until I get phone call with CT results   2. Urinary tract obstruction by kidney stone  - CT RENAL STONE STUDY; Future  3. Hydronephrosis with urinary obstruction due to ureteral calculus  - CT RENAL STONE STUDY; Future   4. Liver lesion  Return to see PCP for follow up in one month

## 2023-07-13 NOTE — Telephone Encounter (Signed)
Spoke with pt and scheduled her an appointment for today with Dr Carlynn Purl. Pt went to ER on yesterday and did testing. However, she did not stay to see doctor because the wait was so long.

## 2023-07-13 NOTE — Telephone Encounter (Signed)
  Chief Complaint: Results Symptoms: NA Frequency: NA Pertinent Negatives: Patient denies NA Disposition: [] ED /[] Urgent Care (no appt availability in office) / [] Appointment(In office/virtual)/ []  [place] [facility]/ [] Home Care/ [] Refused Recommended Disposition /[] [place] [facility]/ []  Follow-up with PCP Additional Notes:   'Joseph' with [facility] calling results, available in Epic. Will call practice, pt instructed to wait at clinic.  Dr. Glenard called, made aware results are available in Epic and pt waiting at CT, states she will call pt.   2 mm mid left ureteral stone again noted, unchanged. No current hydronephrosis. Left nephrolithiasis.

## 2023-07-15 ENCOUNTER — Encounter: Payer: Self-pay | Admitting: Family Medicine

## 2023-07-15 ENCOUNTER — Telehealth: Payer: Self-pay | Admitting: Nurse Practitioner

## 2023-07-15 ENCOUNTER — Other Ambulatory Visit: Payer: Self-pay | Admitting: Family Medicine

## 2023-07-15 DIAGNOSIS — N200 Calculus of kidney: Secondary | ICD-10-CM

## 2023-07-15 NOTE — Telephone Encounter (Signed)
 Provider reviewed result

## 2023-07-15 NOTE — Telephone Encounter (Addendum)
 Pt was instructed to call first thing this morning for her kidney stone.   Pt has a constant throbbing, so she knows it is still there,and concerned it may get blocked. She saw Dr Glenard on Tues.  Please advise. Pt states she is willing to go to [place], [place], anywhere but the ED!

## 2023-07-15 NOTE — Telephone Encounter (Signed)
 I spoke to patient today near lunch time about her result and she was advised if sx worsen to seek ER attention. She is aware of referral and did verbalized her sx had improved and about the same when she spoke to Dr.Sowles on 07/13/23.

## 2023-07-15 NOTE — Telephone Encounter (Signed)
 Called pt and spoke over the phone

## 2023-07-19 ENCOUNTER — Telehealth: Payer: Self-pay | Admitting: *Deleted

## 2023-07-19 ENCOUNTER — Ambulatory Visit
Admission: RE | Admit: 2023-07-19 | Discharge: 2023-07-19 | Disposition: A | Discharge status: Home / Self Care | Payer: 59 | Source: Ambulatory Visit | Attending: Urology | Admitting: Urology

## 2023-07-19 ENCOUNTER — Ambulatory Visit: Payer: 59 | Admitting: Urology

## 2023-07-19 ENCOUNTER — Encounter: Payer: Self-pay | Admitting: *Deleted

## 2023-07-19 ENCOUNTER — Encounter: Payer: Self-pay | Admitting: Urology

## 2023-07-19 ENCOUNTER — Ambulatory Visit: Payer: 59 | Admitting: Nurse Practitioner

## 2023-07-19 VITALS — BP 145/93 | HR 81 | Ht 63.0 in | Wt 184.0 lb

## 2023-07-19 DIAGNOSIS — N200 Calculus of kidney: Secondary | ICD-10-CM

## 2023-07-19 DIAGNOSIS — N201 Calculus of ureter: Secondary | ICD-10-CM

## 2023-07-19 LAB — URINALYSIS, COMPLETE
Bilirubin, UA: NEGATIVE
Glucose, UA: NEGATIVE
Ketones, UA: NEGATIVE
Leukocytes,UA: NEGATIVE
Nitrite, UA: NEGATIVE
Protein,UA: NEGATIVE
RBC, UA: NEGATIVE
Specific Gravity, UA: <1.005 — ABNORMAL LOW (ref 1.005–1.030)
Urobilinogen, Ur: 0.2 mg/dL (ref 0.2–1.0)
pH, UA: 7 (ref 5.0–7.5)

## 2023-07-19 LAB — MICROSCOPIC EXAMINATION

## 2023-07-19 NOTE — Progress Notes (Signed)
 I,Claire King,acting as a scribe for Glendia JAYSON Barba, MD.,have documented all relevant documentation on the behalf of Glendia JAYSON Barba, MD,as directed by  Glendia JAYSON Barba, MD while in the presence of Glendia JAYSON Barba, MD.  07/19/2023 6:42 PM   Claire King 1976-02-20 [id number]  Referring provider: Sowles, Krichna, MD [address] [place],  [place] [address]  Chief Complaint  Patient presents with   Nephrolithiasis    HPI: Claire King is a 48 y.o. female presents for evaluation of a left renal colic.  Presented to ED 07/12/2023 with acute onset left flank pain similar to previous episodes of renal colic. She had nausea without vomiting. Urinalysis in the ED showed >50 RBCs. A CT performed showed a 2 mm left proximal ureteral calculus with mild hydronephrosis. Due to wait time she elected not to stay. She had PCP follow-up the next day and her pain had resolved. A repeat CT was performed which showed resolution of her hydronephrosis however, the stone was still present.  She has intermittent mild symptoms located in the vicinity of the left pelvic area. No fever or chills. Previous history of stone disease and underwent ureteroscopic stone removal in 2017.    PMH: Past Medical History:  Diagnosis Date   Abnormal uterine bleeding    Allergy    Hypertension    Infertility, female    Kidney stones     Surgical History: Past Surgical History:  Procedure Laterality Date   kidney stone with stent      Home Medications:  Allergies as of 07/19/2023   No Known Allergies      Medication List        Accurate as of July 19, 2023  6:42 PM. If you have any questions, ask your nurse or doctor.          STOP taking these medications    dicyclomine  10 MG capsule Commonly known as: BENTYL  Stopped by: Glendia JAYSON Barba   loperamide  2 MG tablet Commonly known as: Imodium  A-D Stopped by: Glendia JAYSON Barba   ondansetron  4 MG disintegrating  tablet Commonly known as: ZOFRAN -ODT Stopped by: Glendia JAYSON Barba       TAKE these medications    fluticasone  50 MCG/ACT nasal spray Commonly known as: Flonase  Place 2 sprays into both nostrils daily as needed for allergies or rhinitis.   lisinopril  5 MG tablet Commonly known as: ZESTRIL  Take 1 tablet (5 mg total) by mouth daily.   meloxicam  7.5 MG tablet Commonly known as: MOBIC  Take 1 tablet (7.5 mg total) by mouth daily.   QC Tumeric Complex 500 MG Caps Generic drug: Turmeric   tamsulosin  0.4 MG Caps capsule Commonly known as: FLOMAX  Take 1 capsule (0.4 mg total) by mouth daily.        Allergies: No Known Allergies  Family History: Family History  Problem Relation Age of Onset   Arthritis Mother    Lung cancer Maternal Grandmother     Social History:  reports that she quit smoking about 14 months ago. Her smoking use included cigarettes. She has a 7.5 pack-year smoking history. She has never used smokeless tobacco. She reports that she does not drink alcohol and does not use drugs.   Physical Exam: BP (!) 145/93   Pulse 81   Ht 5' 3 (1.6 m)   Wt 184 lb (83.5 kg)   BMI 32.59 kg/m   Constitutional:  Alert and oriented, No acute distress. HEENT: [place] AT Respiratory: Normal respiratory  effort, no increased work of breathing. Psychiatric: Normal mood and affect.  Laboratory Data:  Urinalysis Dipstick/microscopy negative  Pertinent Imaging:  CT images personally reviewed and interpreted. On coronal images the calculus measures 2x4 mm.   Assessment & Plan:    1. Left ureteral calculus We discussed various treatment options for urolithiasis including observation with or without medical expulsive therapy, shockwave lithotripsy (SWL), ureteroscopy and laser lithotripsy with stent placement. We discussed that management is based on stone size, location, density, patient co-morbidities, and patient preference.  Stones <62mm in size have a >80% spontaneous  passage rate. Data surrounding the use of tamsulosin  for medical expulsive therapy is controversial, but meta analyses suggests it is most efficacious for distal stones between 5-21mm in size.  SWL has a lower stone free rate in a single procedure, but also a lower complication rate compared to ureteroscopy and avoids a stent and associated stent related symptoms. Possible complications include renal hematoma, steinstrasse, and need for additional treatment. Ureteroscopy with laser lithotripsy and stent placement has a higher stone free rate than SWL in a single procedure, however increased complication rate including possible infection, ureteral injury, bleeding, and stent related morbidity. Common stent related symptoms include dysuria, urgency/frequency, and flank pain. After an extensive discussion of the risks and benefits of the above treatment options, the patient would like to proceed with medical expulsive therapy. A KUB ordered to see if stone visualized on plain xray.   I have reviewed the above documentation for accuracy and completeness, and I agree with the above.   Glendia JAYSON Barba, MD  [facility] [address], [address] [place], [place] [address] [phone]

## 2023-07-19 NOTE — Telephone Encounter (Signed)
-----   Message from Verna Czech [facility] sent at 07/19/2023 12:54 PM EST ----- Regarding: UA results Urine showed no evidence of infection.  Could not definitely see stone but it may be close to passing based on her urinary symptoms

## 2023-07-19 NOTE — Telephone Encounter (Signed)
 Sent a my chart message to patient. Tried to call her first thro.

## 2023-07-26 ENCOUNTER — Encounter: Payer: Self-pay | Admitting: Urology

## 2023-09-17 ENCOUNTER — Ambulatory Visit: Admitting: Nurse Practitioner

## 2023-09-17 ENCOUNTER — Encounter: Payer: Self-pay | Admitting: Nurse Practitioner

## 2023-09-17 VITALS — BP 112/78 | HR 99 | Temp 98.1°F | Resp 18 | Ht 63.0 in | Wt 182.9 lb

## 2023-09-17 DIAGNOSIS — K7689 Other specified diseases of liver: Secondary | ICD-10-CM

## 2023-09-17 NOTE — Progress Notes (Signed)
 BP 112/78   Pulse 99   Temp 98.1 F (36.7 C)   Resp 18   Ht 5\' 3"  (1.6 m)   Wt 182 lb 14.4 oz (83 kg)   SpO2 95%   BMI 32.40 kg/m    Subjective:    Patient ID: Claire King, female    DOB: 1975-07-20, 48 y.o.   MRN: [id number]  HPI: Claire King is a 48 y.o. female  Chief Complaint  Patient presents with   Results    Discuss cyst on liver on CT scan    Discussed the use of AI scribe software for clinical note transcription with the patient, who gave verbal consent to proceed.  History of Present Illness   The patient presents with concerns about liver cysts identified on recent CT scans.  A CT scan on July 12, 2023, revealed multiple lobulated lesions throughout both lobes of the liver, with the largest lesion measuring 3.3 by 2.4 centimeters. These lesions were likely cysts but were incompletely characterized due to the absence of contrast. A follow-up CT scan on July 13, 2023, confirmed the presence of scattered cysts in the liver without any suspicious focal hepatic abnormality or biliary ductal dilation.  She has no current abdominal pain, specifically denying pain in the right upper quadrant. No other symptoms related to the liver cysts are present.   Recent liver function tests from eight months ago showed levels of 22 and 28, and three months ago, levels were 22 and 31.  She has a history of kidney stones, which were symptomatic in the past, but does not recall any liver issues being noted during those episodes.     07/13/2023 EXAM: CT ABDOMEN AND PELVIS WITHOUT CONTRAST   TECHNIQUE: Multidetector CT imaging of the abdomen and pelvis was performed following the standard protocol without IV contrast.   RADIATION DOSE REDUCTION: This exam was performed according to the departmental dose-optimization program which includes automated exposure control, adjustment of the mA and/or kV according to patient size and/or use of iterative reconstruction  technique.   COMPARISON:  07/12/2023   FINDINGS: Lower chest: No acute abnormality.  Small hiatal hernia.   Hepatobiliary: Scattered cysts in the liver. No suspicious focal hepatic abnormality or biliary ductal dilatation. Gallbladder unremarkable.   Pancreas: No focal abnormality or ductal dilatation.   Spleen: No focal abnormality.  Normal size.   Adrenals/Urinary Tract: Previously seen left hydronephrosis has improved with no current hydronephrosis. There continues to be a 2 mm mid left ureteral stone in stable position. Small nonobstructing left renal stones. No stones or hydronephrosis on the right. Adrenal glands and urinary bladder unremarkable.   Stomach/Bowel: Normal appendix. Stomach, large and small bowel grossly unremarkable. Scattered colonic diverticulosis.   Vascular/Lymphatic: No evidence of aneurysm or adenopathy.   Reproductive: Uterus and adnexa unremarkable.  No mass.   Other: No free fluid or free air.   Musculoskeletal: No acute bony abnormality.   IMPRESSION: 2 mm mid left ureteral stone again noted, unchanged. No current hydronephrosis. Left nephrolithiasis.  07/12/2023 EXAM: CT ABDOMEN AND PELVIS WITHOUT CONTRAST   TECHNIQUE: Multidetector CT imaging of the abdomen and pelvis was performed following the standard protocol without IV contrast.   RADIATION DOSE REDUCTION: This exam was performed according to the departmental dose-optimization program which includes automated exposure control, adjustment of the mA and/or kV according to patient size and/or use of iterative reconstruction technique.   COMPARISON:  None Available.   FINDINGS: Lower chest: Clear lung bases.  Hepatobiliary: Multiple lobulated lesions throughout both lobes of the liver, largest lesions measuring simple fluid density. These likely represent cysts, but are incompletely characterized in the absence of contrast. The largest lesion is centrally measuring 3.3 x 2.4  cm, series 2, image 16. The gallbladder is decompressed. No calcified gallstone or pericholecystic inflammation. No biliary dilatation.   Pancreas: No ductal dilatation or inflammation.   Spleen: Normal in size without focal abnormality.   Adrenals/Urinary Tract: No adrenal nodule. Obstructing 2 mm stone in the mid left ureter (at the level of L4) with mild proximal hydroureteronephrosis and perinephric edema. There are few additional punctate left intrarenal calculi. No right nephrolithiasis or hydronephrosis. The urinary bladder is unremarkable, no bladder stone.   Stomach/Bowel: Small hiatal hernia. No bowel obstruction or inflammatory change. Scattered colonic diverticulosis without diverticulitis. Small volume of stool in the colon. The appendix is normal.   Vascular/Lymphatic: Unremarkable unenhanced appearance of the vascular structures. No bulky abdominopelvic adenopathy.   Reproductive: Retroverted uterus.  No adnexal mass.   Other: No free air, free fluid, or intra-abdominal fluid collection. Diminutive fat containing umbilical hernia.   Musculoskeletal: There are no acute or suspicious osseous abnormalities.   IMPRESSION: 1. Obstructing 2 mm stone in the mid left ureter with mild proximal hydroureteronephrosis and perinephric edema. 2. Additional punctate left intrarenal calculi. 3. Multiple lobulated lesions throughout both lobes of the liver, largest lesions measuring simple fluid density. These likely represent cysts, but are incompletely characterized in the absence of contrast. In the absence of known malignancy, no specific imaging follow-up is needed. 4. Small hiatal hernia. Colonic diverticulosis without diverticulitis.      07/13/2023   11:03 AM 02/18/2023    7:41 AM 01/12/2023    8:14 AM  Depression screen PHQ 2/9  Decreased Interest 0 0 0  Down, Depressed, Hopeless 0 0 0  PHQ - 2 Score 0 0 0  Altered sleeping 0    Tired, decreased energy 0     Change in appetite 0    Feeling bad or failure about yourself  0    Trouble concentrating 0    Moving slowly or fidgety/restless 0    Suicidal thoughts 0    PHQ-9 Score 0    Difficult doing work/chores Not difficult at all      Relevant past medical, surgical, family and social history reviewed and updated as indicated. Interim medical history since our last visit reviewed. Allergies and medications reviewed and updated.  Review of Systems  Constitutional: Negative for fever or weight change.  Respiratory: Negative for cough and shortness of breath.   Cardiovascular: Negative for chest pain or palpitations.  Gastrointestinal: Negative for abdominal pain, no bowel changes.  Musculoskeletal: Negative for gait problem or joint swelling.  Skin: Negative for rash.  Neurological: Negative for dizziness or headache.  No other specific complaints in a complete review of systems (except as listed in HPI above).      Objective:    BP 112/78   Pulse 99   Temp 98.1 F (36.7 C)   Resp 18   Ht 5\' 3"  (1.6 m)   Wt 182 lb 14.4 oz (83 kg)   SpO2 95%   BMI 32.40 kg/m    Wt Readings from Last 3 Encounters:  09/17/23 182 lb 14.4 oz (83 kg)  07/19/23 184 lb (83.5 kg)  07/13/23 186 lb (84.4 kg)    Physical Exam Vitals reviewed.  Constitutional:      Appearance: Normal appearance.  HENT:  Head: Normocephalic.  Cardiovascular:     Rate and Rhythm: Normal rate.  Pulmonary:     Effort: Pulmonary effort is normal.  Neurological:     General: No focal deficit present.     Mental Status: She is alert and oriented to person, place, and time. Mental status is at baseline.  Psychiatric:        Mood and Affect: Mood normal.        Behavior: Behavior normal.        Thought Content: Thought content normal.        Judgment: Judgment normal.     Results for orders placed or performed in visit on 07/19/23  Microscopic Examination   Collection Time: 07/19/23 10:43 AM   Urine  Result  Value Ref Range   WBC, UA 0-5 0 - 5 /hpf   RBC, Urine 0-2 0 - 2 /hpf   Epithelial Cells (non renal) 0-10 0 - 10 /hpf   Bacteria, UA Many (A) None seen/Few  Urinalysis, Complete   Collection Time: 07/19/23 10:43 AM  Result Value Ref Range   Specific Gravity, UA <1.005 (L) 1.005 - 1.030   pH, UA 7.0 5.0 - 7.5   Color, UA Yellow Yellow   Appearance Ur Clear Clear   Leukocytes,UA Negative Negative   Protein,UA Negative Negative/Trace   Glucose, UA Negative Negative   Ketones, UA Negative Negative   RBC, UA Negative Negative   Bilirubin, UA Negative Negative   Urobilinogen, Ur 0.2 0.2 - 1.0 mg/dL   Nitrite, UA Negative Negative   Microscopic Examination See below:        Assessment & Plan:   Problem List Items Addressed This Visit       Digestive   Benign liver cyst - Primary     Assessment and Plan    Liver Cysts Multiple lobulated lesions throughout both lobes of the liver, largest measuring 3.3 by 2.4 cm, likely representing cysts. No symptoms or signs of liver dysfunction. Discussed benign nature of cysts and plan for monitoring. -Continue monitoring liver function tests during routine blood work. -No specific imaging follow-up needed at this time. -Report any new onset abdominal pain for further evaluation.        Follow up plan: Return if symptoms worsen or fail to improve.

## 2023-09-23 ENCOUNTER — Other Ambulatory Visit (HOSPITAL_COMMUNITY): Payer: Self-pay

## 2023-11-15 ENCOUNTER — Other Ambulatory Visit: Payer: Self-pay

## 2024-01-06 ENCOUNTER — Ambulatory Visit: Payer: 59 | Admitting: Dermatology

## 2024-01-31 ENCOUNTER — Encounter: Payer: Self-pay | Admitting: Dermatology

## 2024-01-31 ENCOUNTER — Ambulatory Visit: Admitting: Dermatology

## 2024-01-31 DIAGNOSIS — W908XXA Exposure to other nonionizing radiation, initial encounter: Secondary | ICD-10-CM | POA: Diagnosis not present

## 2024-01-31 DIAGNOSIS — D2239 Melanocytic nevi of other parts of face: Secondary | ICD-10-CM | POA: Diagnosis not present

## 2024-01-31 DIAGNOSIS — L578 Other skin changes due to chronic exposure to nonionizing radiation: Secondary | ICD-10-CM | POA: Diagnosis not present

## 2024-01-31 DIAGNOSIS — Z7189 Other specified counseling: Secondary | ICD-10-CM

## 2024-01-31 DIAGNOSIS — L821 Other seborrheic keratosis: Secondary | ICD-10-CM

## 2024-01-31 DIAGNOSIS — D492 Neoplasm of unspecified behavior of bone, soft tissue, and skin: Secondary | ICD-10-CM | POA: Diagnosis not present

## 2024-01-31 DIAGNOSIS — D1801 Hemangioma of skin and subcutaneous tissue: Secondary | ICD-10-CM

## 2024-01-31 DIAGNOSIS — L729 Follicular cyst of the skin and subcutaneous tissue, unspecified: Secondary | ICD-10-CM

## 2024-01-31 DIAGNOSIS — L720 Epidermal cyst: Secondary | ICD-10-CM

## 2024-01-31 DIAGNOSIS — L814 Other melanin hyperpigmentation: Secondary | ICD-10-CM | POA: Diagnosis not present

## 2024-01-31 NOTE — Progress Notes (Unsigned)
 New Patient Visit   Subjective  Claire King is a 48 y.o. female who presents for the following: Spots on face. C/O cherry angioma below left lower eyelid. Mole and right jaw line. Dark macules on forehead. No personal or family Hx of skin cancer.   The patient has spots, moles and lesions to be evaluated, some may be new or changing and the patient may have concern these could be cancer.  The following portions of the chart were reviewed this encounter and updated as appropriate: medications, allergies, medical history  Review of Systems:  No other skin or systemic complaints except as noted in HPI or Assessment and Plan.  Objective  Well appearing patient in no apparent distress; mood and affect are within normal limits.  A focused examination was performed of the following areas: Face  Relevant physical exam findings are noted in the Assessment and Plan.  Right Proximal Mandible 5 mm flesh colored smooth papule        Assessment & Plan   Hemangioma Exam: dilated blood vessel at left lower eyelid. Treatment Plan: Benign appearing on exam Call for changes Counseling for BBL / IPL / Laser and Coordination of Care Discussed the treatment option of Broad Band Light (BBL) /Intense Pulsed Light (IPL)/ Laser for skin discoloration, including brown spots and redness.  Typically we recommend at least 1-3 treatment sessions about 5-8 weeks apart for best results.  Cannot have tanned skin when BBL performed, and regular use of sunscreen/photoprotection is advised after the procedure to help maintain results. The patient's condition may also require maintenance treatments in the future.  The fee for BBL / laser treatments is $350 per treatment session for the whole face. $200 for 1 spot on face.  A fee can be quoted for other parts of the body.  Insurance typically does not pay for BBL/laser treatments and therefore the fee is an out-of-pocket cost. Recommend prophylactic valtrex  treatment. Once scheduled for procedure, will send Rx in prior to patient's appointment.   SEBORRHEIC KERATOSIS - Stuck-on, waxy, tan-brown papules at forehead  - Benign-appearing - Discussed benign etiology and prognosis. - Observe - Call for any changes Discussed cosmetic procedure, SK removal, noncovered.  $60 for 1st lesion and $15 for each additional lesion if done on the same day.  Maximum charge $350.  One touch-up treatment included no charge. Discussed risks of treatment including dyspigmentation, small scar, and/or recurrence. Recommend daily broad spectrum sunscreen SPF 30+/photoprotection to treated areas once healed.  EPIDERMAL INCLUSION CYST Exam: Subcutaneous small papule at left neck proximal mandible  Benign-appearing. Exam most consistent with an epidermal inclusion cyst. Discussed that a cyst is a benign growth that can grow over time and sometimes get irritated or inflamed. Recommend observation if it is not bothersome. Discussed option of surgical excision to remove it if it is growing, symptomatic, or other changes noted. Please call for new or changing lesions so they can be evaluated.  ACTINIC DAMAGE - chronic, secondary to cumulative UV radiation exposure/sun exposure over time - diffuse scaly erythematous macules with underlying dyspigmentation - Recommend daily broad spectrum sunscreen SPF 30+ to sun-exposed areas, reapply every 2 hours as needed.  - Recommend staying in the shade or wearing long sleeves, sun glasses (UVA+UVB protection) and wide brim hats (4-inch brim around the entire circumference of the hat). - Call for new or changing lesions.  LENTIGINES Exam: scattered tan macules Due to sun exposure Treatment Plan: Benign-appearing, observe. Recommend daily broad spectrum sunscreen SPF  30+ to sun-exposed areas, reapply every 2 hours as needed.  Call for any changes  NEOPLASM OF SKIN Right Proximal Mandible Epidermal / dermal shaving  Lesion diameter  (cm):  0.5 Informed consent: discussed and consent obtained   Timeout: patient name, date of birth, surgical site, and procedure verified   Procedure prep:  Patient was prepped and draped in usual sterile fashion Prep type:  Isopropyl alcohol Anesthesia: the lesion was anesthetized in a standard fashion   Anesthetic:  1% lidocaine w/ epinephrine 1-100,000 buffered w/ 8.4% NaHCO3 Instrument used: flexible razor blade   Hemostasis achieved with: pressure, aluminum chloride and electrodesiccation   Outcome: patient tolerated procedure well   Post-procedure details: sterile dressing applied and wound care instructions given   Dressing type: bandage and petrolatum    Specimen 1 - Surgical pathology Differential Diagnosis: Neurofibroma vs Fibrous Papule vs Nevus   Check Margins: Yes ACTINIC SKIN DAMAGE   LENTIGO   HEMANGIOMA OF SKIN   CYST OF SKIN   SEBORRHEIC KERATOSIS     Return for BBL next available, 1 lesion on face.  I, Jill Parcell, CMA, am acting as scribe for Alm Rhyme, MD.   Documentation: I have reviewed the above documentation for accuracy and completeness, and I agree with the above.  Alm Rhyme, MD

## 2024-01-31 NOTE — Patient Instructions (Addendum)
 Counseling for BBL / IPL / Laser and Coordination of Care Discussed the treatment option of Broad Band Light (BBL) /Intense Pulsed Light (IPL)/ Laser for skin discoloration, including brown spots and redness.  Typically we recommend at least 1-3 treatment sessions about 5-8 weeks apart for best results.  Cannot have tanned skin when BBL performed, and regular use of sunscreen/photoprotection is advised after the procedure to help maintain results. The patient's condition may also require maintenance treatments in the future.  The fee for BBL / laser treatments is $350 per treatment session for the whole face. $200 for 1 spot on face.  A fee can be quoted for other parts of the body.  Insurance typically does not pay for BBL/laser treatments and therefore the fee is an out-of-pocket cost. Recommend prophylactic valtrex treatment. Once scheduled for procedure, will send Rx in prior to patient's appointment.    Recommend daily broad spectrum sunscreen SPF 30+ to sun-exposed areas, reapply every 2 hours as needed. Call for new or changing lesions.  Staying in the shade or wearing long sleeves, sun glasses (UVA+UVB protection) and wide brim hats (4-inch brim around the entire circumference of the hat) are also recommended for sun protection.    Wound Care Instructions  Cleanse wound gently with soap and water once a day then pat dry with clean gauze. Apply a thin coat of Petrolatum (petroleum jelly, Vaseline) over the wound (unless you have an allergy to this). We recommend that you use a new, sterile tube of Vaseline. Do not pick or remove scabs. Do not remove the yellow or white healing tissue from the base of the wound.  Cover the wound with fresh, clean, nonstick gauze and secure with paper tape. You may use Band-Aids in place of gauze and tape if the wound is small enough, but would recommend trimming much of the tape off as there is often too much. Sometimes Band-Aids can irritate the skin.  You  should call the office for your biopsy report after 1 week if you have not already been contacted.  If you experience any problems, such as abnormal amounts of bleeding, swelling, significant bruising, significant pain, or evidence of infection, please call the office immediately.  FOR ADULT SURGERY PATIENTS: If you need something for pain relief you may take 1 extra strength Tylenol (acetaminophen) AND 2 Ibuprofen (200mg  each) together every 4 hours as needed for pain. (do not take these if you are allergic to them or if you have a reason you should not take them.) Typically, you may only need pain medication for 1 to 3 days.      Due to recent changes in healthcare laws, you may see results of your pathology and/or laboratory studies on MyChart before the doctors have had a chance to review them. We understand that in some cases there may be results that are confusing or concerning to you. Please understand that not all results are received at the same time and often the doctors may need to interpret multiple results in order to provide you with the best plan of care or course of treatment. Therefore, we ask that you please give us  2 business days to thoroughly review all your results before contacting the office for clarification. Should we see a critical lab result, you will be contacted sooner.   If You Need Anything After Your Visit  If you have any questions or concerns for your doctor, please call our main line at [phone] and press option 4 to  reach your doctor's medical assistant. If no one answers, please leave a voicemail as directed and we will return your call as soon as possible. Messages left after 4 pm will be answered the following business day.   You may also send us  a message via MyChart. We typically respond to MyChart messages within 1-2 business days.  For prescription refills, please ask your pharmacy to contact our office. Our fax number is [phone].  If you have  an urgent issue when the clinic is closed that cannot wait until the next business day, you can page your doctor at the number below.    Please note that while we do our best to be available for urgent issues outside of office hours, we are not available 24/7.   If you have an urgent issue and are unable to reach us , you may choose to seek medical care at your doctor's office, retail clinic, urgent care center, or emergency room.  If you have a medical emergency, please immediately call 911 or go to the emergency department.  Pager Numbers  - Dr. Hester: [phone]  - Dr. Jackquline: [phone]  - Dr. Claudene: [phone]   In the event of inclement weather, please call our main line at [phone] for an update on the status of any delays or closures.  Dermatology Medication Tips: Please keep the boxes that topical medications come in in order to help keep track of the instructions about where and how to use these. Pharmacies typically print the medication instructions only on the boxes and not directly on the medication tubes.   If your medication is too expensive, please contact our office at [phone] option 4 or send us  a message through MyChart.   We are unable to tell what your co-pay for medications will be in advance as this is different depending on your insurance coverage. However, we may be able to find a substitute medication at lower cost or fill out paperwork to get insurance to cover a needed medication.   If a prior authorization is required to get your medication covered by your insurance company, please allow us  1-2 business days to complete this process.  Drug prices often vary depending on where the prescription is filled and some pharmacies may offer cheaper prices.  The website www.goodrx.com contains coupons for medications through different pharmacies. The prices here do not account for what the cost may be with help from insurance (it may be cheaper with  your insurance), but the website can give you the price if you did not use any insurance.  - You can print the associated coupon and take it with your prescription to the pharmacy.  - You may also stop by our office during regular business hours and pick up a GoodRx coupon card.  - If you need your prescription sent electronically to a different pharmacy, notify our office through [facility] or by phone at [phone] option 4.     Si Usted Necesita Algo Despus de Su Visita  Tambin puede enviarnos un mensaje a travs de Clinical cytogeneticist. Por lo general respondemos a los mensajes de MyChart en el transcurso de 1 a 2 das hbiles.  Para renovar recetas, por favor pida a su farmacia que se ponga en contacto con nuestra oficina. Randi lakes de fax es [place] [phone].  Si tiene un asunto urgente cuando la clnica est cerrada y que no puede esperar hasta el siguiente da hbil, puede llamar/localizar a su doctor(a) al nmero que aparece a  continuacin.   Por favor, tenga en cuenta que aunque hacemos todo lo posible para estar disponibles para asuntos urgentes fuera del horario de [place], no estamos disponibles las 24 horas del da, los 7 [address]  [address] de la [place].   Si tiene un problema urgente y no puede comunicarse con nosotros, puede optar por buscar atencin mdica  en el consultorio de su doctor(a), en una clnica privada, en un centro de atencin urgente o en una sala de emergencias.  Si tiene Engineer, drilling, por favor llame inmediatamente al 911 o vaya a la sala de emergencias.  Nmeros de bper  - Dr. Hester: [phone]  - Dra. Jackquline: [phone]  - Dr. Claudene: [phone]   En caso de inclemencias del tiempo, por favor llame a landry capes principal al [phone] para una actualizacin sobre el [place] de cualquier retraso o cierre.  Consejos para la medicacin en dermatologa: Por favor, guarde las cajas en las que vienen los medicamentos de uso tpico para  ayudarle a seguir las instrucciones sobre dnde y cmo usarlos. Las farmacias generalmente imprimen las instrucciones del medicamento slo en las cajas y no directamente en los tubos del [place].   Si su medicamento es muy caro, por favor, pngase en contacto con landry rieger llamando al [phone] y presione la opcin 4 o envenos un mensaje a travs de Clinical cytogeneticist.   No podemos decirle cul ser su copago por los medicamentos por adelantado ya que esto es diferente dependiendo de la cobertura de su seguro. Sin embargo, es posible que podamos encontrar un medicamento sustituto a Audiological scientist un formulario para que el seguro cubra el medicamento que se considera necesario.   Si se requiere una autorizacin previa para que su compaa de seguros malta su medicamento, por favor permtanos de 1 a 2 das hbiles para completar este proceso.  Los precios de los medicamentos varan con frecuencia dependiendo del Environmental consultant de dnde se surte la receta y alguna farmacias pueden ofrecer precios ms baratos.  El sitio web www.goodrx.com tiene cupones para medicamentos de Health and safety inspector. Los precios aqu no tienen en cuenta lo que podra costar con la ayuda del seguro (puede ser ms barato con su seguro), pero el sitio web puede darle el precio si no utiliz Tourist information centre manager.  - Puede imprimir el cupn correspondiente y llevarlo con su receta a la farmacia.  - Tambin puede pasar por nuestra oficina durante el horario de atencin regular y Education officer, museum una tarjeta de cupones de GoodRx.  - Si necesita que su receta se enve electrnicamente a una farmacia diferente, informe a nuestra oficina a travs de MyChart de [place] o por telfono llamando al [phone] y presione la opcin 4.

## 2024-02-01 ENCOUNTER — Encounter: Payer: Self-pay | Admitting: Dermatology

## 2024-02-02 ENCOUNTER — Ambulatory Visit: Payer: Self-pay | Admitting: Dermatology

## 2024-02-02 LAB — SURGICAL PATHOLOGY

## 2024-02-03 NOTE — Telephone Encounter (Addendum)
 Tried calling. No answer. LM for patient to return call. ----- Message from Claire King sent at 02/02/2024  5:46 PM EDT ----- FINAL DIAGNOSIS        1. Skin, right proximal mandible :       MELANOCYTIC NEVUS, INTRADERMAL TYPE, IRRITATED, DEEP MARGIN INVOLVED   Benign irritated mole No further treatment needed ----- Message ----- From: Interface, Lab In Three Zero One Sent: 02/02/2024   5:14 PM EDT To: Claire JAYSON Rhyme, MD

## 2024-02-09 NOTE — Telephone Encounter (Addendum)
 Tried calling patient regarding bx results. No answer. LM for patient to return call.   ----- Message from Alm Rhyme sent at 02/02/2024  5:46 PM EDT ----- FINAL DIAGNOSIS        1. Skin, right proximal mandible :       MELANOCYTIC NEVUS, INTRADERMAL TYPE, IRRITATED, DEEP MARGIN INVOLVED   Benign irritated mole No further treatment needed ----- Message ----- From: Interface, Lab In Three Zero One Sent: 02/02/2024   5:14 PM EDT To: Alm JAYSON Rhyme, MD

## 2024-02-09 NOTE — Telephone Encounter (Signed)
 Patient advised of BX results. aw

## 2024-02-22 ENCOUNTER — Ambulatory Visit (INDEPENDENT_AMBULATORY_CARE_PROVIDER_SITE_OTHER): Payer: Self-pay | Admitting: Dermatology

## 2024-02-22 DIAGNOSIS — I781 Nevus, non-neoplastic: Secondary | ICD-10-CM

## 2024-02-22 DIAGNOSIS — D1801 Hemangioma of skin and subcutaneous tissue: Secondary | ICD-10-CM

## 2024-02-22 NOTE — Progress Notes (Signed)
   Follow-Up Visit   Subjective  Claire King is a 48 y.o. female who presents for the following: here for bbl treatment of spot under left eye  The following portions of the chart were reviewed this encounter and updated as appropriate: medications, allergies, medical history  Review of Systems:  No other skin or systemic complaints except as noted in HPI or Assessment and Plan.  Objective  Well appearing patient in no apparent distress; mood and affect are within normal limits.  A focused examination was performed of the following areas: Face   Relevant exam findings are noted in the Assessment and Plan.         Assessment & Plan   HEMANGIOMA /w Telangiectasias Exam: dilated blood vessel at left lower eyelid   Treatment Plan:  Treated with BBL today  Laser safety: Patient was advised in laser safety.  Patient was fitted with laser safety goggles and advised to keep eyes closed during procedure with goggles on. Staff and provider ensured that patient and their own safety goggles were also on and eyes protected during procedure. Laser room door was secured and locked from the inside. Laser room door has laser safety sign affixed to the outside of the door.     [place] BBL - 02/22/24 1700      Patient Details   Skin Type: I    Photo Takes: Yes    Consent Signed: Yes      Treatment Details   Date: 02/22/24    Treatment #: 1    Area: face    Filter: 1st Pass      1st Pass   Location: F   hemangioma at left lower eye   Device: 560   vascular hemangioma at left lower eyelid   BBL j/cm2: 26    PW Msec Sec: 20    Target Temp: 20    Pulses: 3    7mm: this one     Patient tolerated the procedure well.   [place] avoidance was stressed. The patient will call with any problems, questions or concerns prior to their next appointment.    Return if symptoms worsen or fail to improve.  IEleanor Blush, CMA, am acting as scribe for Alm Rhyme,  MD.   Documentation: I have reviewed the above documentation for accuracy and completeness, and I agree with the above.  Alm Rhyme, MD

## 2024-02-22 NOTE — Patient Instructions (Addendum)
 Send picture of spot on mychart in 6 weeks to let us  see how procedure helped.    Due to recent changes in healthcare laws, you may see results of your pathology and/or laboratory studies on MyChart before the doctors have had a chance to review them. We understand that in some cases there may be results that are confusing or concerning to you. Please understand that not all results are received at the same time and often the doctors may need to interpret multiple results in order to provide you with the best plan of care or course of treatment. Therefore, we ask that you please give us  2 business days to thoroughly review all your results before contacting the office for clarification. Should we see a critical lab result, you will be contacted sooner.   If You Need Anything After Your Visit  If you have any questions or concerns for your doctor, please call our main line at [phone] and press option 4 to reach your doctor's medical assistant. If no one answers, please leave a voicemail as directed and we will return your call as soon as possible. Messages left after 4 pm will be answered the following business day.   You may also send us  a message via MyChart. We typically respond to MyChart messages within 1-2 business days.  For prescription refills, please ask your pharmacy to contact our office. Our fax number is [phone].  If you have an urgent issue when the clinic is closed that cannot wait until the next business day, you can page your doctor at the number below.    Please note that while we do our best to be available for urgent issues outside of office hours, we are not available 24/7.   If you have an urgent issue and are unable to reach us , you may choose to seek medical care at your doctor's office, retail clinic, urgent care center, or emergency room.  If you have a medical emergency, please immediately call 911 or go to the emergency department.  Pager Numbers  - Dr.  Hester: [phone]  - Dr. Jackquline: [phone]  - Dr. Claudene: [phone]   In the event of inclement weather, please call our main line at [phone] for an update on the status of any delays or closures.  Dermatology Medication Tips: Please keep the boxes that topical medications come in in order to help keep track of the instructions about where and how to use these. Pharmacies typically print the medication instructions only on the boxes and not directly on the medication tubes.   If your medication is too expensive, please contact our office at [phone] option 4 or send us  a message through MyChart.   We are unable to tell what your co-pay for medications will be in advance as this is different depending on your insurance coverage. However, we may be able to find a substitute medication at lower cost or fill out paperwork to get insurance to cover a needed medication.   If a prior authorization is required to get your medication covered by your insurance company, please allow us  1-2 business days to complete this process.  Drug prices often vary depending on where the prescription is filled and some pharmacies may offer cheaper prices.  The website www.goodrx.com contains coupons for medications through different pharmacies. The prices here do not account for what the cost may be with help from insurance (it may be cheaper with your insurance), but the website can give you the  price if you did not use any insurance.  - You can print the associated coupon and take it with your prescription to the pharmacy.  - You may also stop by our office during regular business hours and pick up a GoodRx coupon card.  - If you need your prescription sent electronically to a different pharmacy, notify our office through [facility] or by phone at [phone] option 4.     Si Usted Necesita Algo Despus de Su Visita  Tambin puede enviarnos un mensaje a travs de Clinical cytogeneticist. Por lo  general respondemos a los mensajes de MyChart en el transcurso de 1 a 2 das hbiles.  Para renovar recetas, por favor pida a su farmacia que se ponga en contacto con nuestra oficina. Randi lakes de fax es [place] [phone].  Si tiene un asunto urgente cuando la clnica est cerrada y que no puede esperar hasta el siguiente da hbil, puede llamar/localizar a su doctor(a) al nmero que aparece a continuacin.   Por favor, tenga en cuenta que aunque hacemos todo lo posible para estar disponibles para asuntos urgentes fuera del horario de [place], no estamos disponibles las 24 horas del da, los 7 [address]  [address] de la [place].   Si tiene un problema urgente y no puede comunicarse con nosotros, puede optar por buscar atencin mdica  en el consultorio de su doctor(a), en una clnica privada, en un centro de atencin urgente o en una sala de emergencias.  Si tiene Engineer, drilling, por favor llame inmediatamente al 911 o vaya a la sala de emergencias.  Nmeros de bper  - Dr. Hester: [phone]  - Dra. Jackquline: [phone]  - Dr. Claudene: [phone]   En caso de inclemencias del tiempo, por favor llame a landry capes principal al [phone] para una actualizacin sobre el [place] de cualquier retraso o cierre.  Consejos para la medicacin en dermatologa: Por favor, guarde las cajas en las que vienen los medicamentos de uso tpico para ayudarle a seguir las instrucciones sobre dnde y cmo usarlos. Las farmacias generalmente imprimen las instrucciones del medicamento slo en las cajas y no directamente en los tubos del [place].   Si su medicamento es muy caro, por favor, pngase en contacto con landry rieger llamando al [phone] y presione la opcin 4 o envenos un mensaje a travs de Clinical cytogeneticist.   No podemos decirle cul ser su copago por los medicamentos por adelantado ya que esto es diferente dependiendo de la cobertura de su seguro. Sin embargo, es posible que podamos encontrar  un medicamento sustituto a Audiological scientist un formulario para que el seguro cubra el medicamento que se considera necesario.   Si se requiere una autorizacin previa para que su compaa de seguros malta su medicamento, por favor permtanos de 1 a 2 das hbiles para completar este proceso.  Los precios de los medicamentos varan con frecuencia dependiendo del Environmental consultant de dnde se surte la receta y alguna farmacias pueden ofrecer precios ms baratos.  El sitio web www.goodrx.com tiene cupones para medicamentos de Health and safety inspector. Los precios aqu no tienen en cuenta lo que podra costar con la ayuda del seguro (puede ser ms barato con su seguro), pero el sitio web puede darle el precio si no utiliz Tourist information centre manager.  - Puede imprimir el cupn correspondiente y llevarlo con su receta a la farmacia.  - Tambin puede pasar por nuestra oficina durante el horario de atencin regular y Education officer, museum una tarjeta de cupones de GoodRx.  - Si necesita  que su receta se enve electrnicamente a Psychiatrist, informe a nuestra oficina a travs de MyChart de [place] o por telfono llamando al [phone] y presione la opcin 4.

## 2024-02-23 ENCOUNTER — Encounter: Payer: Self-pay | Admitting: Dermatology

## 2024-02-29 ENCOUNTER — Encounter: Admitting: Nurse Practitioner

## 2024-03-07 ENCOUNTER — Ambulatory Visit (INDEPENDENT_AMBULATORY_CARE_PROVIDER_SITE_OTHER): Admitting: Nurse Practitioner

## 2024-03-07 VITALS — BP 124/84 | HR 89 | Temp 98.1°F | Resp 18 | Ht 62.75 in | Wt 186.5 lb

## 2024-03-07 DIAGNOSIS — Z6832 Body mass index (BMI) 32.0-32.9, adult: Secondary | ICD-10-CM

## 2024-03-07 DIAGNOSIS — Z0000 Encounter for general adult medical examination without abnormal findings: Secondary | ICD-10-CM | POA: Diagnosis not present

## 2024-03-07 DIAGNOSIS — Z130 Encounter for screening for diseases of the blood and blood-forming organs and certain disorders involving the immune mechanism: Secondary | ICD-10-CM | POA: Diagnosis not present

## 2024-03-07 DIAGNOSIS — E66811 Obesity, class 1: Secondary | ICD-10-CM

## 2024-03-07 DIAGNOSIS — Z131 Encounter for screening for diabetes mellitus: Secondary | ICD-10-CM | POA: Diagnosis not present

## 2024-03-07 DIAGNOSIS — E6609 Other obesity due to excess calories: Secondary | ICD-10-CM

## 2024-03-07 DIAGNOSIS — Z1231 Encounter for screening mammogram for malignant neoplasm of breast: Secondary | ICD-10-CM | POA: Diagnosis not present

## 2024-03-07 DIAGNOSIS — Z13220 Encounter for screening for lipoid disorders: Secondary | ICD-10-CM

## 2024-03-07 DIAGNOSIS — I10 Essential (primary) hypertension: Secondary | ICD-10-CM

## 2024-03-07 NOTE — Progress Notes (Signed)
 Name: Claire King   MRN: [id number]    DOB: 1976-02-27   Date:03/07/2024       Progress Note  Subjective  Chief Complaint  Chief Complaint  Patient presents with   Annual Exam    HPI  Patient presents for annual CPE. Discussed the use of AI scribe software for clinical note transcription with the patient, who gave verbal consent to proceed.  History of Present Illness A 48 year old female with hypertension and obesity who presents for an annual physical exam.  Obesity - Body weight is 186 pounds - Body mass index (BMI) is 33.3 - Expresses concern regarding weight Waist Measurement : 40 inches  -has been working on lifestyle modification  Hypertension - Not currently taking prescribed lisinopril   Cancer screening status - Overdue for mammogram - Colorectal cancer screening up to date with Cologuard test completed on February 09, 2023 - Pap smear due March 28, 2025 - Completed screenings for HIV and hepatitis C    Diet: well balanced diet Exercise: 2-3 times a week go to the park  Sleep: 6-7 hours Last dental exam:2 weeks Last eye exam: long time ago, using readers  [employer] Office Visit from 03/07/20[facility] in [facility]  AUDIT-C Score 0   Depression: Phq 9 is  negative    03/07/2024   11:00 AM 07/13/2023   11:03 AM 02/18/2023    7:41 AM 01/12/2023    8:14 AM 05/08/2020    3:01 PM  Depression screen PHQ 2/9  Decreased Interest 0 0 0 0 0  Down, Depressed, Hopeless 0 0 0 0 0  PHQ - 2 Score 0 0 0 0 0  Altered sleeping 0 0   0  Tired, decreased energy 0 0   0  Change in appetite 0 0   0  Feeling bad or failure about yourself  0 0   0  Trouble concentrating 0 0   0  Moving slowly or fidgety/restless 0 0   0  Suicidal thoughts 0 0   0  PHQ-9 Score 0 0   0  Difficult doing work/chores Not difficult at all Not difficult at all      Hypertension: BP Readings from Last 3 Encounters:  03/07/24 124/84  09/17/23 112/78  07/19/23 (!)  145/93   Obesity: Wt Readings from Last 3 Encounters:  03/07/24 186 lb 8 oz (84.6 kg)  09/17/23 182 lb 14.4 oz (83 kg)  07/19/23 184 lb (83.5 kg)   BMI Readings from Last 3 Encounters:  03/07/24 33.30 kg/m  09/17/23 32.40 kg/m  07/19/23 32.59 kg/m    [employer] Visit [facility] 03/07/[facility]  1 40 inches     Vaccines:  HPV: up to at age 49 , ask insurance if age between 61-45  Shingrix: 32-64 yo and ask insurance if covered when patient above 90 yo Pneumonia:  educated and discussed with patient. Flu:  educated and discussed with patient.  Hep C Screening: completed STD testing and prevention (HIV/chl/gon/syphilis): completed Intimate partner violence:none Sexual History :married Menstrual History/LMP/Abnormal Bleeding: LMP: spotting once in a while last one was June Incontinence Symptoms: none  Breast cancer:  - Last Mammogram: 04/18/2020, ordered - BRCA gene screening: none  Osteoporosis: Discussed high calcium and vitamin D supplementation, weight bearing exercises  Cervical cancer screening: 03/28/2020  Skin cancer: Discussed monitoring for atypical lesions  Colorectal cancer: 02/09/2023   Lung cancer:   Low Dose CT Chest recommended  if Age 38-80 years, 20 pack-year currently smoking OR have quit w/in 15years. Patient does not qualify.   ECG: none  Advanced Care Planning: A voluntary discussion about advance care planning including the explanation and discussion of advance directives.  Discussed health care proxy and Living will, and the patient was able to identify a health care proxy as Peyson Delao.  Patient does not have a living will at present time. If patient does have living will, I have requested they bring this to the clinic to be scanned in to their chart.  Lipids: Lab Results  Component Value Date   CHOL 163 01/12/2023   CHOL 133 03/22/2020   Lab Results  Component Value Date   HDL 55 01/12/2023   HDL  44 03/22/2020   Lab Results  Component Value Date   LDLCALC 84 01/12/2023   LDLCALC 74 03/22/2020   Lab Results  Component Value Date   TRIG 137 01/12/2023   TRIG 72 03/22/2020   Lab Results  Component Value Date   CHOLHDL 3.0 01/12/2023   CHOLHDL 3.0 03/22/2020   No results found for: LDLDIRECT  Glucose: Glucose  Date Value Ref Range Status  03/22/2020 82 65 - 99 mg/dL Final   Glucose, Bld  Date Value Ref Range Status  07/12/2023 105 (H) 70 - 99 mg/dL Final    Comment:    Glucose reference range applies only to samples taken after fasting for at least 8 hours.  01/12/2023 102 (H) 65 - 99 mg/dL Final    Comment:    .            Fasting reference interval . For someone without known diabetes, a glucose value between 100 and 125 mg/dL is consistent with prediabetes and should be confirmed with a follow-up test. .     Patient Active Problem List   Diagnosis Date Noted   Benign liver cyst 09/17/2023   Primary hypertension 01/12/2023   Class 1 obesity due to excess calories with serious comorbidity and body mass index (BMI) of 32.0 to 32.9 in adult 01/12/2023    Past Surgical History:  Procedure Laterality Date   kidney stone with stent      Family History  Problem Relation Age of Onset   Arthritis Mother    Lung cancer Maternal Grandmother    Cancer Maternal Grandmother     Social History   Socioeconomic History   Marital status: Married    Spouse name: Not on file   Number of children: 1   Years of education: Not on file   Highest education level: Bachelor's degree (e.g., BA, AB, BS)  Occupational History   Not on file  Tobacco Use   Smoking status: Former    Current packs/day: 0.00    Average packs/day: 0.7 packs/day for 22.5 years (15.0 ttl pk-yrs)    Types: Cigarettes    Quit date: 05/12/2022    Years since quitting: 1.8   Smokeless tobacco: Never  Vaping Use   Vaping status: Never Used  Substance and Sexual Activity   Alcohol use:  Never   Drug use: Never   Sexual activity: Yes    Birth control/protection: None    Comment: infertility  Other Topics Concern   Not on file  Social History Narrative   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[facility] [place]    Social Drivers of Health   Financial Resource Strain: Low Risk  (03/07/2024)   Overall Financial Resource Strain (CARDIA)  Difficulty of Paying Living Expenses: Not hard at all  Food Insecurity: No Food Insecurity (03/07/2024)   Hunger Vital Sign    Worried About Running Out of Food in the Last Year: Never true    Ran Out of Food in the Last Year: Never true  Transportation Needs: No Transportation Needs (03/07/2024)   PRAPARE - Administrator, Civil Service (Medical): No    Lack of Transportation (Non-Medical): No  Physical Activity: Insufficiently Active (03/07/2024)   Exercise Vital Sign    Days of Exercise per Week: 2 days    Minutes of Exercise per Session: 40 min  Stress: No Stress Concern Present (03/07/2024)   [employer] of Occupational Health - Occupational Stress Questionnaire    Feeling of Stress: Only a little  Social Connections: Moderately Isolated (03/07/2024)   Social Connection and Isolation Panel    Frequency of Communication with Friends and Family: Once a week    Frequency of Social Gatherings with Friends and Family: Never    Attends Religious Services: More than 4 times per year    Active Member of [employer] or Organizations: No    Attends Engineer, structural: Not on file    Marital Status: Married  Catering manager Violence: Not At Risk (03/07/2024)   Humiliation, Afraid, Rape, and Kick questionnaire    Fear of Current or Ex-Partner: No    Emotionally Abused: No    Physically Abused: No    Sexually Abused: No     Current Outpatient Medications:    fluticasone  (FLONASE ) 50 MCG/ACT nasal spray, Place 2 sprays into both nostrils daily as needed for allergies or rhinitis., Disp: 15.8 mL, Rfl: 2  No  Known Allergies   ROS  Constitutional: Negative for fever or weight change.  Respiratory: Negative for cough and shortness of breath.   Cardiovascular: Negative for chest pain or palpitations.  Gastrointestinal: Negative for abdominal pain, no bowel changes.  Musculoskeletal: Negative for gait problem or joint swelling.  Skin: Negative for rash.  Neurological: Negative for dizziness or headache.  No other specific complaints in a complete review of systems (except as listed in HPI above).   Objective  Vitals:   03/07/24 1058  BP: 124/84  Pulse: 89  Resp: 18  Temp: 98.1 F (36.7 C)  SpO2: 94%  Weight: 186 lb 8 oz (84.6 kg)  Height: 5' 2.75 (1.594 m)    Body mass index is 33.3 kg/m.  Physical Exam Vitals reviewed.  Constitutional:      Appearance: Normal appearance.  HENT:     Head: Normocephalic.     Right Ear: Tympanic membrane normal.     Left Ear: Tympanic membrane normal.     Nose: Nose normal.  Eyes:     Extraocular Movements: Extraocular movements intact.     Conjunctiva/sclera: Conjunctivae normal.     Pupils: Pupils are equal, round, and reactive to light.  Neck:     Thyroid: No thyroid mass, thyromegaly or thyroid tenderness.  Cardiovascular:     Rate and Rhythm: Normal rate and regular rhythm.     Pulses: Normal pulses.     Heart sounds: Normal heart sounds.  Pulmonary:     Effort: Pulmonary effort is normal.     Breath sounds: Normal breath sounds.  Abdominal:     General: Bowel sounds are normal.     Palpations: Abdomen is soft.  Musculoskeletal:        General: Normal range of motion.  Cervical back: Normal range of motion and neck supple.     Right lower leg: No edema.     Left lower leg: No edema.  Skin:    General: Skin is warm and dry.     Capillary Refill: Capillary refill takes less than 2 seconds.  Neurological:     General: No focal deficit present.     Mental Status: She is alert and oriented to person, place, and time. Mental  status is at baseline.  Psychiatric:        Mood and Affect: Mood normal.        Behavior: Behavior normal.        Thought Content: Thought content normal.        Judgment: Judgment normal.      Recent Results (from the past 2160 hours)  Surgical pathology     Status: None   Collection Time: 01/31/24 12:00 AM  Result Value Ref Range   SURGICAL PATHOLOGY      SURGICAL PATHOLOGY [facility] [address], [address] [place], [place] [address] Telephone [phone] or [phone] Fax [phone]  REPORT OF DERMATOPATHOLOGY   Accession #: [id number] Patient Name: SEBASTIAN, DZIK Visit # : [id number]  MRN: [id number] Cytotechnologist: Ephriam Rolla Edelman, Dermatopathologist, Electronic Signature DOB/Age 03/09/1976 (Age: 45) Gender: F Collected Date: 01/31/2024 Received Date: 02/01/2024  FINAL DIAGNOSIS       1. Skin, right proximal mandible :       MELANOCYTIC NEVUS, INTRADERMAL TYPE, IRRITATED, DEEP MARGIN INVOLVED       DATE SIGNED OUT: 02/02/2024 ELECTRONIC SIGNATURE : Depcik-Smith Md, Natalie, Dermatopathologist, Electronic Signature  MICROSCOPIC DESCRIPTION 1. There is a proliferation of banal melanocytes predominantly in the dermis. There is no atypia.  There are changes suggesting that this lesion has been irritated.  The lesion extends to the deep margin of the specimen.   CASE COMMENTS STAINS USED IN DIAGNOSIS: H&E    CLINICAL HISTORY  SPECIMEN(S) OBTAINED 1. Skin, Right Proximal Mandible  SPECIMEN COMMENTS: 1. 5 mm flesh colored smooth papule, check margins SPECIMEN CLINICAL INFORMATION: 1. Neoplasm of skin, neurofibroma vs fibrous papule vs nevus    Gross Description 1. Formalin fixed specimen received:  8 X 7 X 2 MM, TOTO (3 P) (1 B) ( QB ) margins inked.        Report signed out from the following location(s) [place]. [facility] [address] [place], [place] [address] CLIA #:  [id number]  [facility] [address] [place], [place] [address] CLIA #: [id number]     Fall Risk:    03/07/2024   11:00 AM 09/17/2023    8:06 AM 07/13/2023   11:03 AM 02/18/2023    7:41 AM 01/12/2023    8:14 AM  Fall Risk   Falls in the past year? 0 0 0 0 0  Number falls in past yr: 0 0 0 0 0  Injury with Fall? 0 0 0 0 0  Follow up Falls evaluation completed Falls evaluation completed        Functional Status Survey: Is the patient deaf or have difficulty hearing?: No Does the patient have difficulty seeing, even when wearing glasses/contacts?: No Does the patient have difficulty concentrating, remembering, or making decisions?: No Does the patient have difficulty walking or climbing stairs?: No Does the patient have difficulty dressing or bathing?: No Does the patient have difficulty doing errands alone such as visiting a doctor's office or shopping?: No  Assessment & Plan  Problem List Items Addressed This Visit       Cardiovascular and Mediastinum   Primary hypertension   Relevant Orders   CBC with Differential/Platelet   Comprehensive metabolic panel with GFR     Other   Class 1 obesity due to excess calories with serious comorbidity and body mass index (BMI) of 32.0 to 32.9 in adult   Relevant Orders   TSH   Other Visit Diagnoses       Annual physical exam    -  Primary   Relevant Orders   CBC with Differential/Platelet   Comprehensive metabolic panel with GFR   Lipid panel   Hemoglobin A1c   TSH     Encounter for screening mammogram for malignant neoplasm of breast       Relevant Orders   MM 3D SCREENING MAMMOGRAM BILATERAL BREAST     Screening for diabetes mellitus       Relevant Orders   Comprehensive metabolic panel with GFR   Hemoglobin A1c     Screening for deficiency anemia       Relevant Orders   CBC with Differential/Platelet     Screening for cholesterol level       Relevant Orders   Lipid panel      Assessment and  Plan Assessment & Plan Adult Wellness Visit Annual physical examination completed. HIV and hepatitis C screenings completed. Overdue for mammogram. Up to date on colorectal cancer screening with Cologuard as of February 09, 2023. Pap smear due next year on March 28, 2024.  Obesity BMI is 33.3, indicating obesity. She expressed concern about weight loss and is considering GLP-1 medications. Discussion about risks and benefits of GLP-1 medications conducted. She will consider the information and decide later.  Hypertension Currently not taking lisinopril . Blood pressure remains well-controlled without medication.    -USPSTF grade A and B recommendations reviewed with patient; age-appropriate recommendations, preventive care, screening tests, etc discussed and encouraged; healthy living encouraged; see AVS for patient education given to patient -Discussed importance of 150 minutes of physical activity weekly, eat two servings of fish weekly, eat one serving of tree nuts ( cashews, pistachios, pecans, almonds.SABRA) every other day, eat 6 servings of fruit/vegetables daily and drink plenty of water and avoid sweet beverages.   -Reviewed Health Maintenance: yes

## 2024-04-12 ENCOUNTER — Encounter: Payer: Self-pay | Admitting: Dermatology

## 2024-04-24 ENCOUNTER — Telehealth: Payer: Self-pay

## 2024-04-24 NOTE — Telephone Encounter (Addendum)
 Tried calling patient regarding previous laser treatment she had 7 weeks ago to see how she was doing. No answer. LM for patient to return call.  ----- Message from [facility] Eleanor W sent at 02/22/2024  5:25 PM EDT ----- Contact patient after 7 weeks to see how she did with laser treatment
